# Patient Record
Sex: Female | Born: 1975 | Race: White | Hispanic: No | Marital: Single | Smoking: Current every day smoker
Health system: Southern US, Community
[De-identification: ages and names within clinical notes are randomized; demographics above are authoritative.]

## PROBLEM LIST (undated history)

## (undated) DIAGNOSIS — T7840XA Allergy, unspecified, initial encounter: Secondary | ICD-10-CM

## (undated) DIAGNOSIS — F32A Depression, unspecified: Secondary | ICD-10-CM

## (undated) DIAGNOSIS — F419 Anxiety disorder, unspecified: Secondary | ICD-10-CM

## (undated) DIAGNOSIS — F329 Major depressive disorder, single episode, unspecified: Secondary | ICD-10-CM

## (undated) DIAGNOSIS — G43909 Migraine, unspecified, not intractable, without status migrainosus: Secondary | ICD-10-CM

## (undated) DIAGNOSIS — I10 Essential (primary) hypertension: Secondary | ICD-10-CM

## (undated) DIAGNOSIS — M4800 Spinal stenosis, site unspecified: Secondary | ICD-10-CM

## (undated) DIAGNOSIS — G894 Chronic pain syndrome: Secondary | ICD-10-CM

## (undated) DIAGNOSIS — M47812 Spondylosis without myelopathy or radiculopathy, cervical region: Secondary | ICD-10-CM

## (undated) DIAGNOSIS — M47816 Spondylosis without myelopathy or radiculopathy, lumbar region: Secondary | ICD-10-CM

## (undated) HISTORY — DX: Spondylosis without myelopathy or radiculopathy, lumbar region: M47.816

## (undated) HISTORY — DX: Chronic pain syndrome: G89.4

## (undated) HISTORY — PX: WISDOM TOOTH EXTRACTION: SHX21

## (undated) HISTORY — DX: Allergy, unspecified, initial encounter: T78.40XA

## (undated) HISTORY — DX: Depression, unspecified: F32.A

## (undated) HISTORY — DX: Major depressive disorder, single episode, unspecified: F32.9

## (undated) HISTORY — DX: Essential (primary) hypertension: I10

## (undated) HISTORY — DX: Spondylosis without myelopathy or radiculopathy, cervical region: M47.812

## (undated) HISTORY — DX: Spinal stenosis, site unspecified: M48.00

## (undated) HISTORY — DX: Migraine, unspecified, not intractable, without status migrainosus: G43.909

## (undated) HISTORY — DX: Anxiety disorder, unspecified: F41.9

---

## 2004-07-14 ENCOUNTER — Ambulatory Visit: Payer: Self-pay | Admitting: Unknown Physician Specialty

## 2005-05-21 ENCOUNTER — Ambulatory Visit: Payer: Self-pay

## 2006-08-17 IMAGING — CT CT NECK WITH CONTRAST
1 of 2 series · 11 of 14 positions shown, 14 images · non-contrast
Comparison: none

REASON FOR EXAM: Thyroid nodule
COMMENTS:

[Series 3: neck 3.0 b40f · axial · 0.52mm/px · z∈[+108,+348]mm · 11 of 96 slices shown, 14 images]
[im 8/96  soft-tissue]
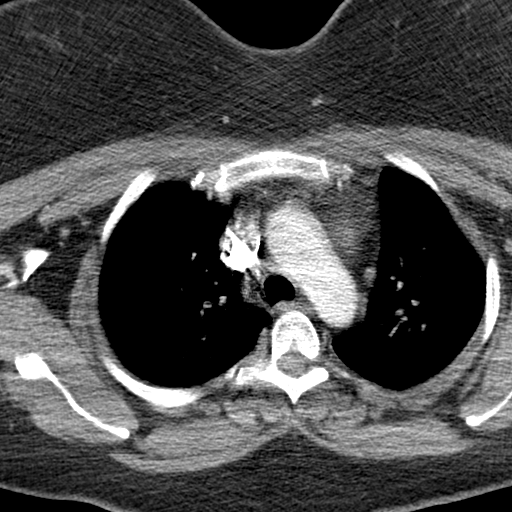
[im 8/96  bone]
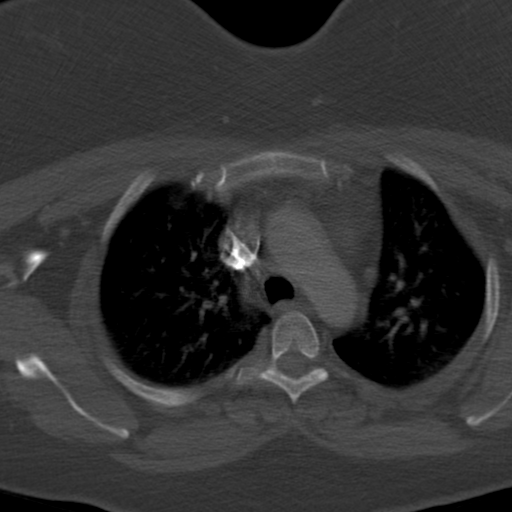
[im 16/96  bone]
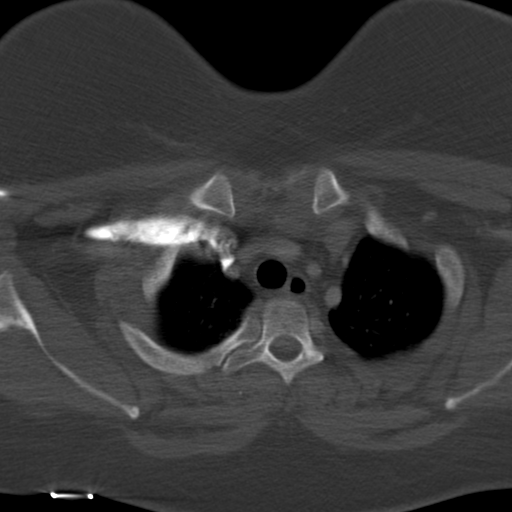
[im 24/96  bone]
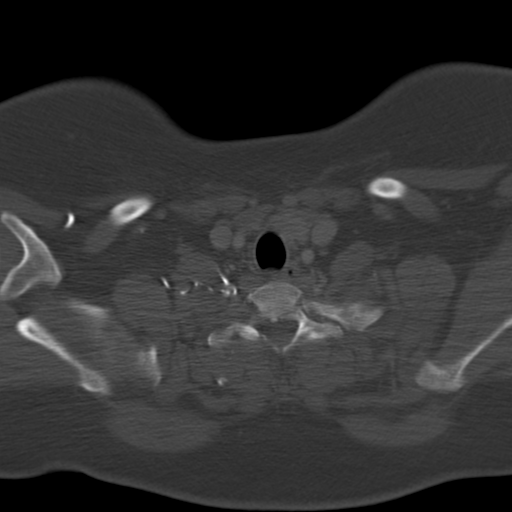
[im 32/96  bone]
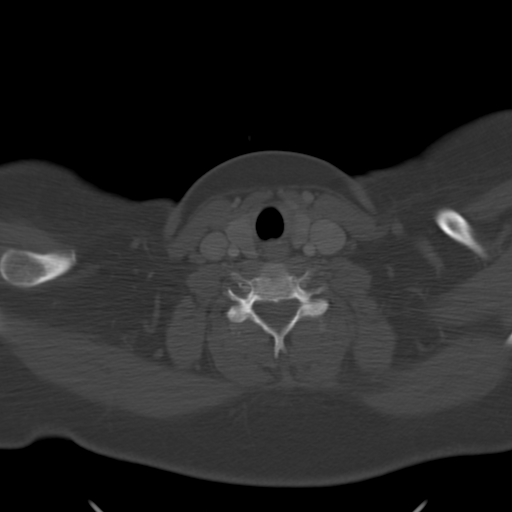
[im 40/96  soft-tissue]
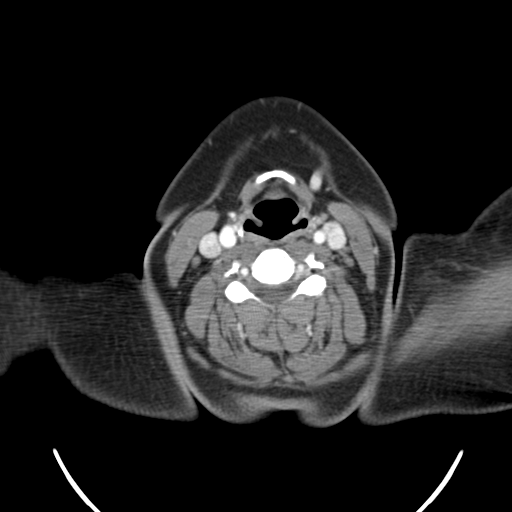
[im 40/96  bone]
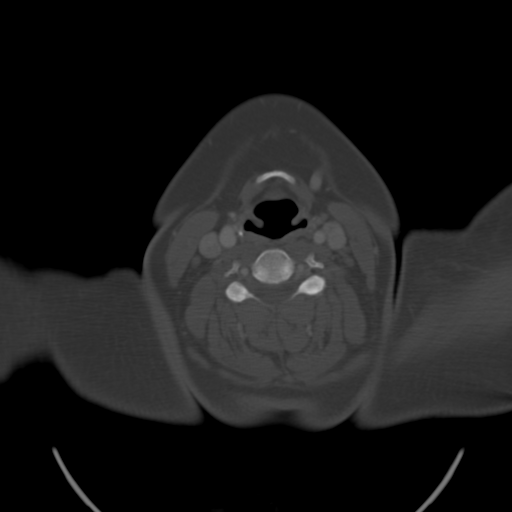
[im 48/96  bone]
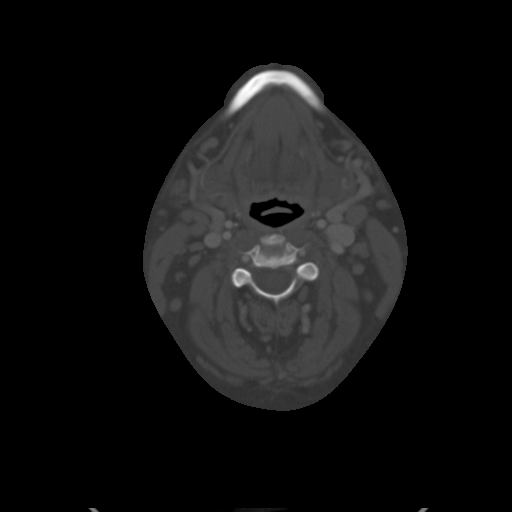
[im 56/96  bone]
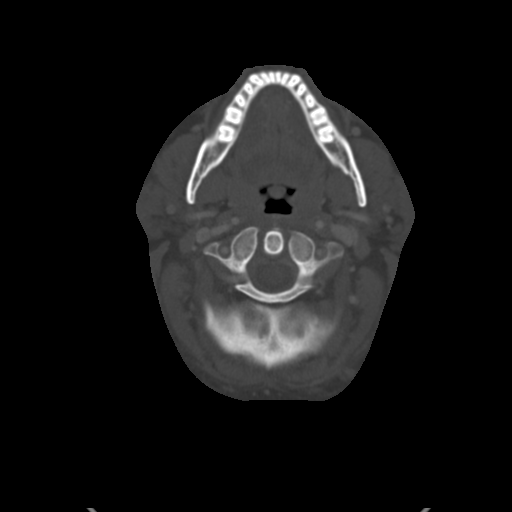
[im 64/96  bone]
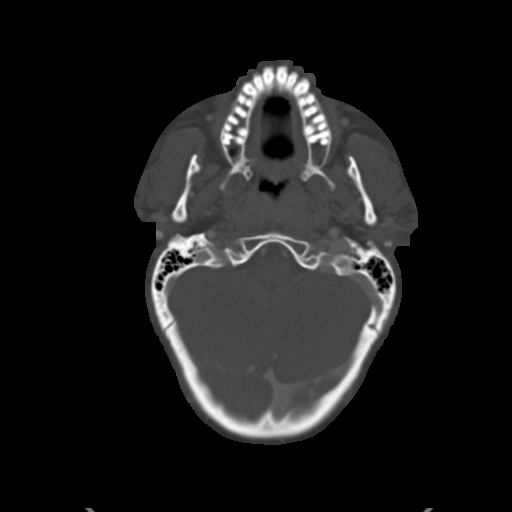
[im 72/96  soft-tissue]
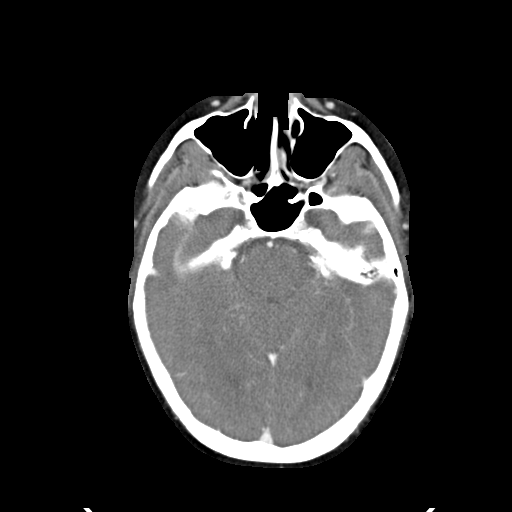
[im 72/96  bone]
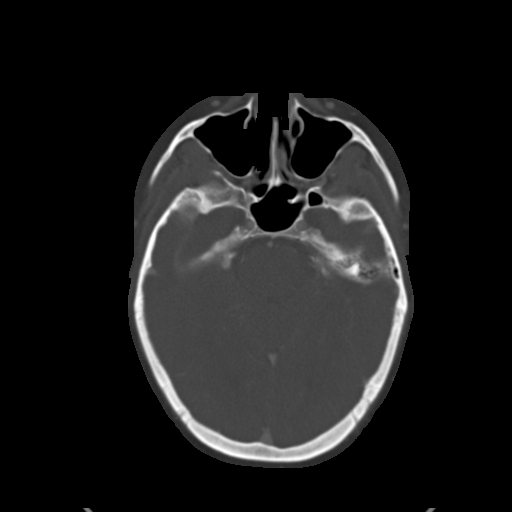
[im 80/96  bone]
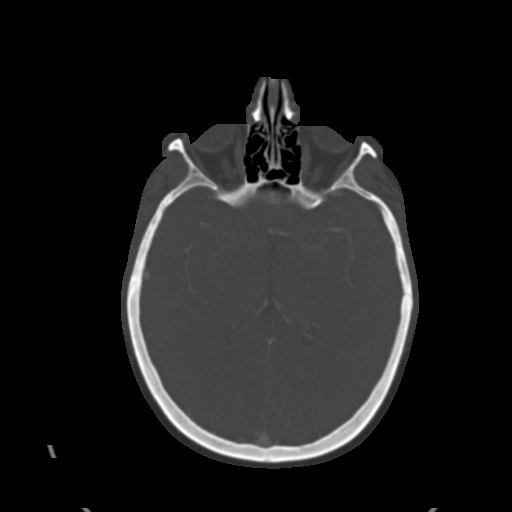
[im 88/96  bone]
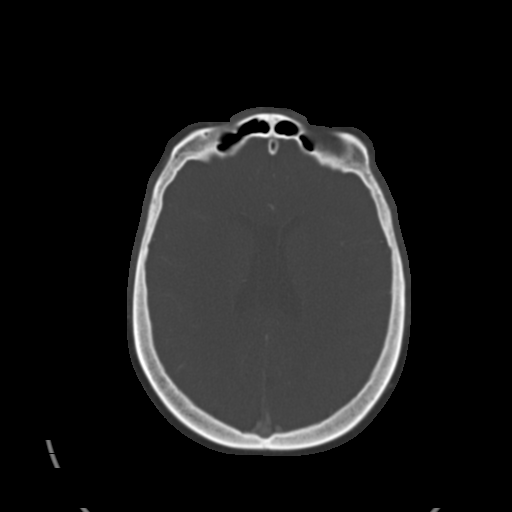

[11 of 14 positions shown; findings below may reference images not displayed]

PROCEDURE:     CT  - CT NECK WITH CONTRAST  - July 14, 2004  [DATE]

RESULT:     Spiral 3.0 mm sections were obtained from the skull base through
the thoracic inlet status post intravenous administration of 100 cc's of
[id number].

Bilateral adenopathy is demonstrated within the carotid space primarily
anteriorly with the largest lymph node on the LEFT measuring 1.0 cm in
diameter and on the RIGHT measuring 1.4 cm in diameter. No evidence of
further neck masses, free fluid or drainable loculated fluid collections are
identified. The opacified vascular structures appear unremarkable.
Evaluation of the thyroid demonstrates no evidence of thyroid nodules or
masses. The visualized lung apices appear unremarkable.
IMPRESSION: 1.     Bilateral lymph nodes within the RIGHT and LEFT carotid space as
described above with the largest nodes as described above, on the RIGHT
and on the LEFT 1.0 cm. No further neck masses are identified.
2.     There does not appear to be CT evidence of thyroid masses or nodules.
There does not appear to be CT evidence of thyroid enlargement.

## 2011-02-16 ENCOUNTER — Ambulatory Visit: Payer: Self-pay | Admitting: Family Medicine

## 2012-01-19 ENCOUNTER — Ambulatory Visit (INDEPENDENT_AMBULATORY_CARE_PROVIDER_SITE_OTHER): Payer: BC Managed Care – PPO | Admitting: Family Medicine

## 2012-01-19 VITALS — BP 147/99 | HR 100 | Temp 98.0°F | Resp 18 | Ht 64.5 in | Wt 248.4 lb

## 2012-01-19 DIAGNOSIS — Q762 Congenital spondylolisthesis: Secondary | ICD-10-CM

## 2012-01-19 DIAGNOSIS — M4310 Spondylolisthesis, site unspecified: Secondary | ICD-10-CM

## 2012-01-19 DIAGNOSIS — I10 Essential (primary) hypertension: Secondary | ICD-10-CM

## 2012-01-19 DIAGNOSIS — Z23 Encounter for immunization: Secondary | ICD-10-CM

## 2012-01-19 DIAGNOSIS — M4802 Spinal stenosis, cervical region: Secondary | ICD-10-CM

## 2012-01-19 DIAGNOSIS — F341 Dysthymic disorder: Secondary | ICD-10-CM

## 2012-01-19 DIAGNOSIS — G43909 Migraine, unspecified, not intractable, without status migrainosus: Secondary | ICD-10-CM

## 2012-01-19 DIAGNOSIS — F419 Anxiety disorder, unspecified: Secondary | ICD-10-CM

## 2012-01-19 MED ORDER — CYCLOBENZAPRINE HCL 10 MG PO TABS: 10.0000 mg | ORAL_TABLET | Freq: Three times a day (TID) | ORAL | Status: DC | PRN

## 2012-01-19 MED ORDER — OMEPRAZOLE 20 MG PO CPDR: 20.0000 mg | DELAYED_RELEASE_CAPSULE | Freq: Every day | ORAL | Status: DC

## 2012-01-19 MED ORDER — METOPROLOL TARTRATE 50 MG PO TABS: ORAL_TABLET | ORAL | Status: DC

## 2012-01-19 MED ORDER — MELOXICAM 15 MG PO TABS: 15.0000 mg | ORAL_TABLET | Freq: Every day | ORAL | Status: DC

## 2012-01-19 MED ORDER — PROMETHAZINE HCL 25 MG PO TABS: 25.0000 mg | ORAL_TABLET | Freq: Four times a day (QID) | ORAL | Status: DC | PRN

## 2012-01-19 MED ORDER — FLUTICASONE PROPIONATE 50 MCG/ACT NA SUSP: 2.0000 | Freq: Every day | NASAL | Status: DC

## 2012-01-19 MED ORDER — HYDROCODONE-ACETAMINOPHEN 7.5-500 MG PO TABS: 1.0000 | ORAL_TABLET | Freq: Three times a day (TID) | ORAL | Status: DC | PRN

## 2012-01-19 NOTE — Patient Instructions (Addendum)
1. Anxiety and depression   2. Cervical spinal stenosis   3. Essential hypertension, benign   4. Spondylolisthesis   5. Migraine

## 2012-01-19 NOTE — Progress Notes (Signed)
[address]   [place], [place]  [address]   [phone]  Subjective:    Patient ID: Natalie Dawson, female    DOB: 03-15-1976, 36 y.o.   MRN: [id number]  HPIThis 36 y.o. female presents for evaluation of back pain.   Around 12/06/11, onset of severe back pain and neck pain.  Acute worsening of pain.  At that time, had xray, MRI completed by [facility].  Had referred to [facility]. [facility]nie [facility].  Originally evaluated by PA.  Upon return of MRI, referred to Dr. Jaynie Collins.  MRI cervical spine  Completed; MRI lumbar spine abnormal.  Appointment 12/06/11 with Jaynie Collins; called to see if could get in sooner; called [facility] because of worsening pain.  Evaluated by Huston Foley September 12; unable to walk; unable to get into and out of car; of course, out of work.  Placed on Prednisone; took for two days; suffered with anxiety, insomnia, increased appetite.  Quit taking it after two days; called Huston Foley and advised unable to take Prednisone; Sharen Hint also filled Vicodin chronic rx.  Then called in Mobic but has not helped; has taken for past month.  Feels no different.  Called the next week while at work because out of Vicodin due to increased frequency.  Asked for Ultram rx because worked in past for back pain.  Sharen Hint refilled Vicodin early despite pt asking for Ultram rx on 12/27/11 for 30 tablets one every six hours PRN.  Two weeks later, needs more medication.  S/p evaluation by Dr. Jaynie Collins middle September; Dr. Jaynie Collins said would eventually need surgery; spinal stenosis of cervical spine and spondylolisthesis.  Low back pain does not bother her until she stands prolonged.  Upper back pain the worst; when worsens, unable to sleep.  Dr.Lim said OK to pop back.  Recommended PT.  When patient asked Dr. Jaynie Collins about pain medication, recommended going back to PCP.    Sharen Hint refused to refill due to appointment with [facility].  Lenoria Farrier office manager contacted by pt; Steward Drone Apple called [facility] regarding appointment.   Quit job during all this.  Job needed FMLA filled out again.  Steward Drone has been wonderful; she has called almost daily; Steward Drone said that Debbie recommended calling Nilda Simmer, M.D.  Needs to be able to function.  Mother works with patient.  [employer] was distraught due to patient quitting.  Mother was driving her crazy.     Review of Systems  Constitutional: Negative for fever, chills, diaphoresis and fatigue.  Respiratory: Negative for cough, shortness of breath and wheezing.   Cardiovascular: Negative for chest pain, palpitations and leg swelling.  Musculoskeletal: Positive for myalgias and back pain. Negative for joint swelling and arthralgias.  Neurological: Positive for headaches. Negative for dizziness, tremors, syncope, facial asymmetry, speech difficulty, weakness, light-headedness and numbness.  Psychiatric/Behavioral: Positive for dysphoric mood. Negative for suicidal ideas, behavioral problems, sleep disturbance, self-injury and agitation. The patient is nervous/anxious.         Past Medical History  Diagnosis Date  . Allergy   . Anxiety   . Depression   . Hypertension   . Spinal stenosis   . Migraine     Past Surgical History  Procedure Date  . Wisdom tooth extraction     Prior to Admission medications   Medication Sig Start Date End Date Taking? Authorizing Provider  ALPRAZolam Prudy Feeler) 0.5 MG tablet Take 1 tablet (0.5 mg total) by mouth at bedtime as needed. 02/25/12  Ethelda Chick, MD  amLODipine (NORVASC) 5 MG tablet Take 5 mg by mouth daily.   Yes Historical Provider, MD  cyclobenzaprine (FLEXERIL) 10 MG tablet Take 1 tablet (10 mg total) by mouth 3 (three) times daily as needed. 01/19/12  Yes Ethelda Chick, MD  fexofenadine-pseudoephedrine (ALLEGRA-D 24) 180-240 MG per 24 hr tablet Take 1 tablet by mouth daily.   Yes Historical Provider, MD  fluticasone (FLONASE) 50 MCG/ACT nasal spray Place 2 sprays into the nose daily. 01/19/12   Ethelda Chick, MD  fluticasone  (VERAMYST) 27.5 MCG/SPRAY nasal spray Place 2 sprays into the nose daily.   Yes Historical Provider, MD  HYDROcodone-acetaminophen (LORTAB 7.5) 7.5-500 MG per tablet Take 1 tablet by mouth every 8 (eight) hours as needed for pain. 01/19/12   Ethelda Chick, MD  HYDROcodone-acetaminophen (VICODIN) 5-500 MG per tablet Take 1 tablet by mouth every 6 (six) hours as needed.   Yes Historical Provider, MD  meloxicam (MOBIC) 15 MG tablet Take 1 tablet (15 mg total) by mouth daily. 01/19/12  Yes Ethelda Chick, MD  metoprolol (LOPRESSOR) 50 MG tablet 1/2 to 1 twice daily 01/19/12   Ethelda Chick, MD  omeprazole (PRILOSEC) 20 MG capsule Take 1 capsule (20 mg total) by mouth daily. 01/19/12  Yes Ethelda Chick, MD  promethazine (PHENERGAN) 25 MG tablet Take 1 tablet (25 mg total) by mouth every 6 (six) hours as needed. 01/19/12  Yes Ethelda Chick, MD  sertraline (ZOLOFT) 100 MG tablet Take 1 tablet (100 mg total) by mouth daily. 03/05/12   Nelva Nay, PA-C    Not on File  History   Social History  . Marital Status: Single    Spouse Name: N/A    Number of Children: N/A  . Years of Education: N/A   Occupational History  . Not on file.   Social History Main Topics  . Smoking status: Current Every Day Smoker -- 0.5 packs/day    Types: Cigarettes  . Smokeless tobacco: Not on file  . Alcohol Use: Not on file  . Drug Use: Not on file  . Sexually Active: Not on file   Other Topics Concern  . Not on file   Social History Narrative  . No narrative on file    Family History  Problem Relation Age of Onset  . Depression Mother   . Hyperlipidemia Mother   . Anxiety disorder Mother   . Diabetes Father   . Hypertension Father   . Cancer Maternal Grandmother   . Cancer Maternal Grandfather   . Heart attack Maternal Grandfather   . Cancer Paternal Grandmother   . Alzheimer's disease Paternal Grandmother   . Cancer Paternal Grandfather     Objective:   Physical Exam  Nursing note and  vitals reviewed. Constitutional: She is oriented to person, place, and time. She appears well-developed and well-nourished. No distress.  HENT:  Mouth/Throat: Oropharynx is clear and moist.  Eyes: Conjunctivae normal are normal. Pupils are equal, round, and reactive to light.  Neck: Neck supple. No JVD present. No thyromegaly present.  Cardiovascular: Normal rate, regular rhythm and normal heart sounds.  Exam reveals no gallop and no friction rub.   No murmur heard. Pulmonary/Chest: Effort normal and breath sounds normal. She has no wheezes. She has no rales.  Musculoskeletal:       Right shoulder: She exhibits normal range of motion, no tenderness and no bony tenderness.       Left shoulder:  She exhibits normal range of motion, no tenderness and no bony tenderness.       Cervical back: She exhibits decreased range of motion, tenderness, pain and spasm. She exhibits no bony tenderness.       Lumbar back: She exhibits tenderness and pain. She exhibits normal range of motion, no bony tenderness and no spasm.  Lymphadenopathy:    She has no cervical adenopathy.  Neurological: She is alert and oriented to person, place, and time. No cranial nerve deficit. She exhibits normal muscle tone. Coordination normal.  Skin: No rash noted. She is not diaphoretic.  Psychiatric: She has a normal mood and affect. Her behavior is normal. Judgment and thought content normal.   INFLUENZA VACCINE ADMINISTERED.    Assessment & Plan:   1. Anxiety and depression    2. Cervical spinal stenosis    3. Essential hypertension, benign    4. Spondylolisthesis    5. Migraine    6. Need for prophylactic vaccination and inoculation against influenza  Flu vaccine greater than or equal to 3yo preservative free IM    1.  Anxiety and depression:  Persistent.  Unable to see psychiatry due to lack of insurance.  Refilled medications today. 2.  Cervical spinal stenosis:  New.  S/p ortho consultation with MRI.  Physical  therapy and pain management recommended but patient now unemployed. Agreeable to managing pain until finds employment; once employed will warrant referral to pain management. 3. HTN:  Rx for Metoprolol provided.   4.  Spondylolisthesis:  New.  S/p ortho consult.  PT and pain management recommended but now uninsured.  Willing to provide with pain management until employed.   5. Migraine HA:  Stable.  Refilled medications. 6. S/p flu vaccine.   Meds ordered this encounter  Medications  . DISCONTD: omeprazole (PRILOSEC) 20 MG capsule    Sig: Take 20 mg by mouth daily.  . fexofenadine-pseudoephedrine (ALLEGRA-D 24) 180-240 MG per 24 hr tablet    Sig: Take 1 tablet by mouth daily.  Marland Kitchen DISCONTD: sertraline (ZOLOFT) 100 MG tablet    Sig: Take 100 mg by mouth daily.  Marland Kitchen amLODipine (NORVASC) 5 MG tablet    Sig: Take 5 mg by mouth daily.  Marland Kitchen DISCONTD: meloxicam (MOBIC) 15 MG tablet    Sig: Take 15 mg by mouth daily.  Marland Kitchen DISCONTD: cyclobenzaprine (FLEXERIL) 10 MG tablet    Sig: Take 10 mg by mouth 3 (three) times daily as needed.  Marland Kitchen HYDROcodone-acetaminophen (VICODIN) 5-500 MG per tablet    Sig: Take 1 tablet by mouth every 6 (six) hours as needed.  Marland Kitchen DISCONTD: promethazine (PHENERGAN) 25 MG tablet    Sig: Take 25 mg by mouth every 6 (six) hours as needed.  Marland Kitchen DISCONTD: ALPRAZolam (XANAX) 0.5 MG tablet    Sig: Take 0.5 mg by mouth at bedtime as needed.  . fluticasone (VERAMYST) 27.5 MCG/SPRAY nasal spray    Sig: Place 2 sprays into the nose daily.  . cyclobenzaprine (FLEXERIL) 10 MG tablet    Sig: Take 1 tablet (10 mg total) by mouth 3 (three) times daily as needed.    Dispense:  270 tablet    Refill:  1  . fluticasone (FLONASE) 50 MCG/ACT nasal spray    Sig: Place 2 sprays into the nose daily.    Dispense:  16 g    Refill:  6  . meloxicam (MOBIC) 15 MG tablet    Sig: Take 1 tablet (15 mg total) by mouth daily.  Dispense:  90 tablet    Refill:  1  . omeprazole (PRILOSEC) 20 MG capsule     Sig: Take 1 capsule (20 mg total) by mouth daily.    Dispense:  90 capsule    Refill:  1  . promethazine (PHENERGAN) 25 MG tablet    Sig: Take 1 tablet (25 mg total) by mouth every 6 (six) hours as needed.    Dispense:  60 tablet    Refill:  5  . metoprolol (LOPRESSOR) 50 MG tablet    Sig: 1/2 to 1 twice daily    Dispense:  180 tablet    Refill:  3  . HYDROcodone-acetaminophen (LORTAB 7.5) 7.5-500 MG per tablet    Sig: Take 1 tablet by mouth every 8 (eight) hours as needed for pain.    Dispense:  90 tablet    Refill:  2

## 2012-02-25 ENCOUNTER — Telehealth: Payer: Self-pay

## 2012-02-25 MED ORDER — ALPRAZOLAM 0.5 MG PO TABS: 0.5000 mg | ORAL_TABLET | Freq: Every evening | ORAL | Status: DC | PRN

## 2012-02-25 NOTE — Telephone Encounter (Signed)
Called in for her, called her to advise.  

## 2012-02-25 NOTE — Telephone Encounter (Signed)
OK to call in:  Alprazolam 0.5mg  one po q d PRN #30 5 refills.  KMS

## 2012-02-25 NOTE — Telephone Encounter (Signed)
PATIENT IS REQUESTING A REFILL ON ALPRAZOLAM 0.5MG . SHE STATES SHE IS DR. SMITH'S PATIENT. PLEASE CALL HER WHEN IT HAS BEEN DONE. BEST PHONE [phone] (CELL)  [facility]ICE [facility] [place] [phone]   MBC

## 2012-03-05 ENCOUNTER — Encounter: Payer: Self-pay | Admitting: Family Medicine

## 2012-03-05 ENCOUNTER — Telehealth: Payer: Self-pay

## 2012-03-05 MED ORDER — SERTRALINE HCL 100 MG PO TABS: 100.0000 mg | ORAL_TABLET | Freq: Every day | ORAL | Status: DC

## 2012-03-05 NOTE — Telephone Encounter (Signed)
Rx done and sent to pharmacy 

## 2012-03-05 NOTE — Telephone Encounter (Signed)
Dr. Katrinka Blazing   Pt is requesting refill on Zoloft 100 mg - [facility] [place]   CBN:  [phone]

## 2012-03-06 NOTE — Telephone Encounter (Signed)
Thanks, I called patient to advise.  

## 2012-03-10 NOTE — Progress Notes (Signed)
Reviewed and agree.

## 2012-04-02 ENCOUNTER — Other Ambulatory Visit: Payer: Self-pay | Admitting: Family Medicine

## 2012-04-03 ENCOUNTER — Telehealth: Payer: Self-pay

## 2012-04-03 NOTE — Telephone Encounter (Signed)
Please advise on the Hydrocodone. Pended same as previous Rx

## 2012-04-03 NOTE — Telephone Encounter (Signed)
Pt calling to make sure we have received her hydrocodone rx from the pharmacy. Please call her at [phone]

## 2012-04-04 ENCOUNTER — Other Ambulatory Visit: Payer: Self-pay | Admitting: Family Medicine

## 2012-04-04 MED ORDER — HYDROCODONE-ACETAMINOPHEN 7.5-500 MG PO TABS: 1.0000 | ORAL_TABLET | Freq: Three times a day (TID) | ORAL | Status: DC | PRN

## 2012-04-04 NOTE — Telephone Encounter (Signed)
Dr Katrinka Blazing, please advise. Also, we did not see your printed/signed Rx, but I can still call Rx in from the info in EPIC. What do you want to do about the fill date?

## 2012-04-04 NOTE — Telephone Encounter (Signed)
1.  Please call pt back. I do not remember advising her that she could take an extra pill if pain worsened or with migraine.  Usually, if I advise such, I will give a few additional pills with rx.  However, she is 16 days early for refill; thus that means she has taken 48 additional pills over the past three months (i.e. An extra 16 pills per month which is usually one extra pill every other day) which I know I did not approve.  I also discussed my strict narcotic policy with her at the visit; she is not to lose any rx, she is not to need early refills, she is not to receive medication from any other providers.  How many pills does she have left of current rx?

## 2012-04-04 NOTE — Telephone Encounter (Signed)
Call --- I am OK if she needs a fourth tablet once per week on average but no more than that.  OK to call in RF.  I already approved rx but did not print off rx; I will do this in the future.  Please call in RF to pharmacy.

## 2012-04-04 NOTE — Telephone Encounter (Signed)
Pt called about hydrocodone refill. I relayed the communication with the dr to her and she states that the pharmacy has not heard from [place] and that she will be out before the fill date of 1/26 b/c dr Katrinka Blazing told her if she was having a bad day or migraine she could take an extra one, which throws the count off.   Please call pt with questions. She is frustrated that she was told differently about her dose than she is receiving.  bf

## 2012-04-04 NOTE — Telephone Encounter (Signed)
Called pt and gave her Dr Michaelle Copas message. She stated that she really did think that Dr Katrinka Blazing had told her that if she is having an esp bad day, or has a migraine, she could take a fourth tab that day. She stated she has 4 tabs left. She stated that she got the first Rx filled 10/26 for #90, then next fill 11/21 for #90, then last filled on 12/16 for #90. I called pharmacy and verified this is correct. She states that if you don't ever want her taking more than 3 tabs a day, she will abide by that from now on, but she is worried about what she should do for migraines when she gets them since she can not use Imitrex NS anymore d/t BP. Pt states that she thinks she is supposed to come back for a re-check on her BP/new medication soon anyway and that if Dr Katrinka Blazing will RF the hydrocodone x 1, she will plan to come in for re-check for both w/in the month. Dr Katrinka Blazing, please advise.

## 2012-04-04 NOTE — Telephone Encounter (Signed)
Please call in refill for hydrocodone that was signed/approved.  After reviewing chart, I provided original rx on 10/26 with two refills.  She will not be due for refill until 04/20/12.  I have noted this on rx.  KMS

## 2012-04-06 NOTE — Telephone Encounter (Signed)
lmom to cb. 

## 2012-04-06 NOTE — Telephone Encounter (Signed)
Pt states that she cannot wait until 1/26 for the hydrocodone.  She took her last tablet today.  Would you like for her to just come in to discuss this issue?

## 2012-04-08 NOTE — Telephone Encounter (Signed)
Called in Rx to pharmacy and gave OK to fill today. Notified pt on her VM that we have called it in and that Dr Katrinka Blazing does not want pt to take any more than 1 extra tab per week. Advised pt to CB if she needs extra more often than that for migraines so that we can discuss w/Dr Katrinka Blazing if another medication would be appropriate for that.

## 2012-04-08 NOTE — Telephone Encounter (Signed)
OK to approve early refill for fill today.  No need to come into office to discuss.

## 2012-05-28 ENCOUNTER — Telehealth: Payer: Self-pay

## 2012-05-28 NOTE — Telephone Encounter (Signed)
PT WOULD LIKE TO SPEAK WITH SOMEONE REGARDING HER VICODIN. STATES THE PHARMACY STATED THE STRENGTH ON THE VICODIN IS NO LONGER DONE WOULD LIKE A CALL BACK AT [phone]

## 2012-05-29 NOTE — Telephone Encounter (Signed)
Left message for her to call me back. 

## 2012-05-29 NOTE — Telephone Encounter (Signed)
Called [facility]  [place] to change remaining Rx's for Hydrocodone from 7.5/500 to 7.5 325, to you FYI Amy

## 2012-06-14 ENCOUNTER — Encounter: Payer: Self-pay | Admitting: *Deleted

## 2012-06-14 DIAGNOSIS — M4800 Spinal stenosis, site unspecified: Secondary | ICD-10-CM | POA: Insufficient documentation

## 2012-06-30 ENCOUNTER — Telehealth: Payer: Self-pay

## 2012-06-30 ENCOUNTER — Other Ambulatory Visit: Payer: Self-pay | Admitting: Family Medicine

## 2012-06-30 NOTE — Telephone Encounter (Signed)
Call -- rx approved; should be called into pharmacy today; please advise pt that she will need follow-up visit in upcoming month before she can get further refills.

## 2012-06-30 NOTE — Telephone Encounter (Signed)
Pt states we should be receiving a refill request from [facility] for her hydrocodone. States due to dosage changes and only filling certain amounts in the last couple of months, she will be out before her fill date. States she has enough for today and tomorrow but none for Wednesday. States not supposed to refill until 07/07/12. Pt sees Dr Katrinka Blazing.  Best: [phone] bf

## 2012-06-30 NOTE — Telephone Encounter (Signed)
Called in for her. Called her to advise meds sent in for her, told her she needs office visit. She will call back for office visit.

## 2012-07-24 ENCOUNTER — Ambulatory Visit (INDEPENDENT_AMBULATORY_CARE_PROVIDER_SITE_OTHER): Payer: Self-pay | Admitting: Family Medicine

## 2012-07-24 VITALS — BP 150/103 | HR 93 | Temp 99.3°F | Resp 18 | Ht 65.0 in | Wt 265.0 lb

## 2012-07-24 DIAGNOSIS — F419 Anxiety disorder, unspecified: Secondary | ICD-10-CM

## 2012-07-24 DIAGNOSIS — I10 Essential (primary) hypertension: Secondary | ICD-10-CM

## 2012-07-24 DIAGNOSIS — R5383 Other fatigue: Secondary | ICD-10-CM

## 2012-07-24 DIAGNOSIS — F411 Generalized anxiety disorder: Secondary | ICD-10-CM

## 2012-07-24 DIAGNOSIS — M4800 Spinal stenosis, site unspecified: Secondary | ICD-10-CM

## 2012-07-24 LAB — POCT CBC
Granulocyte percent: 54.2 %G (ref 37–80)
HCT, POC: 44.9 % (ref 37.7–47.9)
Hemoglobin: 14.1 g/dL (ref 12.2–16.2)
POC Granulocyte: 5.4 (ref 2–6.9)
POC LYMPH PERCENT: 35.6 %L (ref 10–50)
RDW, POC: 12.7 %

## 2012-07-24 LAB — POCT URINALYSIS DIPSTICK
Bilirubin, UA: NEGATIVE
Ketones, UA: NEGATIVE
Leukocytes, UA: NEGATIVE
Protein, UA: NEGATIVE

## 2012-07-24 MED ORDER — SERTRALINE HCL 100 MG PO TABS: 150.0000 mg | ORAL_TABLET | Freq: Every day | ORAL | Status: DC

## 2012-07-24 MED ORDER — FLUTICASONE PROPIONATE 50 MCG/ACT NA SUSP: 2.0000 | Freq: Every day | NASAL | Status: DC

## 2012-07-24 MED ORDER — HYDROCODONE-ACETAMINOPHEN 7.5-325 MG PO TABS: 1.0000 | ORAL_TABLET | Freq: Three times a day (TID) | ORAL | Status: DC | PRN

## 2012-07-24 MED ORDER — ALPRAZOLAM 0.5 MG PO TABS: 0.5000 mg | ORAL_TABLET | Freq: Every evening | ORAL | Status: DC | PRN

## 2012-07-24 NOTE — Patient Instructions (Signed)
1. INCREASE METOPROLOL TO TWICE DAILY. 2. INCREASE ZOLOFT TO 1.5 TABLETS DAILY.

## 2012-07-24 NOTE — Progress Notes (Signed)
[address]   [place], [place]  [address]   [id number]  Subjective:    Patient ID: Natalie Dawson, female    DOB: April 10, 1975, 36 y.o.   MRN: [id number]  HPI This 37 y.o. female presents for six month follow-up:  1.  Anxiety and depression: mentally doing much better; left [facility] in 04/2012.  Thereasa Distance still not working; pt thinks Thereasa Distance is depressed.  Has new boyfriend; dating since March 2014.  Living with mother and Bethann Berkshire.  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  Work is exhausting pt.  Depression has improved but now has noticed that anxiety has worsened; realizes that should not need Xanax daily but continues to use daily.  Uneasy that staying with mother who does not really want her living with her.    2.  Fatigue:  Absolutely exhausted. No energy.  New boyfriend very active.  Wants to go do things but physically does not want to.  Worried about secondary cause.  Just doesn't feel good.  Feels like sugar drops; gets nauseated and hungry.  Went to [employer] and ate some fries; felt better but nausea returns.  Googled symptoms; has every symptom of fibromyalgia.  Father with multiple health problems; pt is a clone.  Father is DMII, ESRD secondary to HTN, melanoma, DDD, macular degeneration.  Last lab work several months ago.  No previous sleep study; has gained weight since last visit.  3. Bump in genital region: onset one week ago; no improvement; tender.  No burning; does not feel like wart.  New partner; worried about STD.  4. HTN: not checking blood pressure at home; only taking Metoprolol one at qhs due to daytime nausea.    5.  Nausea: occurs frequently; has been going on several months; not sure if narcotic is cause of nausea.  No vomiting; worried about low blood sugar; nausea improves with eating; some heartburn and indigestion; no diarrhea; has gained weight since last visit.  6. Chronic neck and lower back pain: persistent; taking hydrocodone tid; could use  hydrocodone more often; taking Ibuprofen in between hydrocodone use.  Caring for twins who are 2 old is very exhausting.  Has contacted [facility] pain management several times; awaiting appointment; has long waiting list. No insurance currently.   No follow-up with [facility] spine specialist.     Review of Systems  Constitutional: Positive for activity change and fatigue. Negative for fever, chills and diaphoresis.  Respiratory: Positive for shortness of breath. Negative for cough, wheezing and stridor.   Cardiovascular: Negative for chest pain, palpitations and leg swelling.  Gastrointestinal: Positive for nausea. Negative for vomiting, abdominal pain, diarrhea, constipation and blood in stool.  Endocrine: Negative for cold intolerance, heat intolerance, polydipsia, polyphagia and polyuria.  Genitourinary: Positive for genital sores. Negative for vaginal bleeding, vaginal discharge, vaginal pain, menstrual problem and pelvic pain.  Musculoskeletal: Positive for myalgias, back pain, arthralgias and gait problem. Negative for joint swelling.  Skin: Negative for wound.  Neurological: Positive for headaches. Negative for dizziness, tremors, seizures, syncope, facial asymmetry, speech difficulty, weakness, light-headedness and numbness.  Psychiatric/Behavioral: Negative for suicidal ideas, sleep disturbance, self-injury and dysphoric mood. The patient is nervous/anxious.        Objective:   Physical Exam  Nursing note and vitals reviewed. Constitutional: She is oriented to person, place, and time. She appears well-developed and well-nourished. No distress.  Obese.  HENT:  Head: Normocephalic and atraumatic.  Mouth/Throat: Oropharynx is clear and moist.  Eyes: Conjunctivae and EOM are normal. Pupils are equal, round, and reactive to light.  Neck: Normal range of motion. Neck supple. No thyromegaly present.  Obese.  Cardiovascular: Normal rate, regular rhythm, normal heart sounds and intact  distal pulses.  Exam reveals no gallop and no friction rub.   No murmur heard. Pulmonary/Chest: Effort normal and breath sounds normal. She has no wheezes. She has no rales.  Abdominal: Soft. Bowel sounds are normal. She exhibits no distension. There is no tenderness. There is no rebound and no guarding.  Genitourinary: There is no rash, tenderness or lesion on the right labia. There is no rash, tenderness or lesion on the left labia.  R labia majora with palpable 3 mm diameter mobile glandular lesion.  Musculoskeletal:       Right shoulder: Normal.       Left shoulder: Normal.       Cervical back: She exhibits normal range of motion and no tenderness.       Lumbar back: She exhibits decreased range of motion, tenderness and pain. She exhibits no bony tenderness and no spasm.  Lymphadenopathy:    She has no cervical adenopathy.  Neurological: She is alert and oriented to person, place, and time. No cranial nerve deficit. She exhibits normal muscle tone. Coordination normal.  Skin: Skin is warm and dry. She is not diaphoretic.  Psychiatric: She has a normal mood and affect. Her behavior is normal. Judgment and thought content normal.          Results for orders placed in visit on 07/24/12  POCT CBC      Result Value Range   WBC 10.0  4.6 - 10.2 K/uL   Lymph, poc 3.6 (*) 0.6 - 3.4   POC LYMPH PERCENT 35.6  10 - 50 %L   MID (cbc) 1.0 (*) 0 - 0.9   POC MID % 10.2  0 - 12 %M   POC Granulocyte 5.4  2 - 6.9   Granulocyte percent 54.2  37 - 80 %G   RBC 4.85  4.04 - 5.48 M/uL   Hemoglobin 14.1  12.2 - 16.2 g/dL   HCT, POC 81.1  91.4 - 47.9 %   MCV 92.6  80 - 97 fL   MCH, POC 29.1  27 - 31.2 pg   MCHC 31.4 (*) 31.8 - 35.4 g/dL   RDW, POC 78.2     Platelet Count, POC 248  142 - 424 K/uL   MPV 10.1  0 - 99.8 fL  POCT URINALYSIS DIPSTICK      Result Value Range   Color, UA yellow     Clarity, UA clear     Glucose, UA neg     Bilirubin, UA neg     Ketones, UA neg     Spec Grav, UA  >=1.030     Blood, UA trace     pH, UA 5.5     Protein, UA neg     Urobilinogen, UA 0.2     Nitrite, UA neg     Leukocytes, UA Negative      Assessment & Plan:  Other malaise and fatigue - Plan: POCT CBC, Comprehensive metabolic panel, TSH, POCT urinalysis dipstick  Spinal stenosis  Essential hypertension, benign  Anxiety   1. Malaise and fatigue:  New.  Obtain labs.  Highly suggestive of OSA yet currently no insurance so cannot perform sleep study; recommend weight loss, exercise. 2.  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  Discussed addition of Neurontin yet sedating and pt suffering with fatigue.  Recommend Flexeril qhs for spasm. 3.  HTN: uncontrolled due to non-compliance with bid Metoprolol; to increase Metoprolol to bid; to start checking BP at home and call if readings remain >  140/90. 4.  Anxiety and depression: improving depression symptoms but anxiety worsening; increase Zoloft 100mg  to 1.5 tablets daily; continue once daily Xanax. 5.  Nausea; New.  Obtain labs; likely due to narcotic use; recommend HOLDING narcotic in am to see if nausea improves.   Meds ordered this encounter  Medications  . B Complex-C (B-COMPLEX WITH VITAMIN C) tablet    Sig: Take 1 tablet by mouth daily.  . Multiple Vitamin (MULTIVITAMIN) tablet    Sig: Take 1 tablet by mouth daily.  . sertraline (ZOLOFT) 100 MG tablet    Sig: Take 1.5 tablets (150 mg total) by mouth daily.    Dispense:  45 tablet    Refill:  5    Order Specific Question:  Supervising Provider    Answer:  DOOLITTLE, ROBERT P [[id number]]  . HYDROcodone-acetaminophen (NORCO) 7.5-325 MG per tablet    Sig: Take 1 tablet by mouth every 8 (eight) hours as needed for pain.    Dispense:  90 tablet    Refill:  0  . fluticasone (FLONASE) 50 MCG/ACT nasal spray     Sig: Place 2 sprays into the nose daily.    Dispense:  16 g    Refill:  6  . ALPRAZolam (XANAX) 0.5 MG tablet    Sig: Take 1 tablet (0.5 mg total) by mouth at bedtime as needed.    Dispense:  30 tablet    Refill:  5

## 2012-07-25 LAB — COMPREHENSIVE METABOLIC PANEL
ALT: 21 U/L (ref 0–35)
Albumin: 4.5 g/dL (ref 3.5–5.2)
Alkaline Phosphatase: 85 U/L (ref 39–117)
Glucose, Bld: 89 mg/dL (ref 70–99)
Potassium: 4.1 mEq/L (ref 3.5–5.3)
Sodium: 140 mEq/L (ref 135–145)
Total Protein: 7.4 g/dL (ref 6.0–8.3)

## 2012-07-31 NOTE — Progress Notes (Signed)
Left msg for patient to schedule 3 month appt with Dr. Katrinka Blazing.

## 2012-08-05 NOTE — Progress Notes (Signed)
Sent pt reminder letter to schedule 3 month follow up appt.

## 2012-08-25 ENCOUNTER — Other Ambulatory Visit: Payer: Self-pay | Admitting: Family Medicine

## 2012-08-25 ENCOUNTER — Telehealth: Payer: Self-pay

## 2012-08-25 NOTE — Telephone Encounter (Signed)
PT STATES SHE NEED A REFILL ON HER HYDROCODONE. PLEASE CALL [phone]

## 2012-08-26 ENCOUNTER — Telehealth: Payer: Self-pay

## 2012-08-26 NOTE — Telephone Encounter (Signed)
Patient is calling back about her hydrocodone medication. York Spaniel that is should be sent over to her pharmacy. [phone]

## 2012-08-27 NOTE — Telephone Encounter (Signed)
Hydrocodone refilled 08/27/12.

## 2012-08-27 NOTE — Telephone Encounter (Signed)
Called in for her. Called patient. She states she is feeling better.

## 2012-08-27 NOTE — Telephone Encounter (Signed)
Rx approved and to be called in.  Please check status and advise pt.

## 2012-10-11 ENCOUNTER — Other Ambulatory Visit: Payer: Self-pay | Admitting: Physician Assistant

## 2012-10-28 ENCOUNTER — Ambulatory Visit: Payer: Self-pay | Admitting: Family Medicine

## 2012-10-28 ENCOUNTER — Encounter: Payer: Self-pay | Admitting: Family Medicine

## 2012-10-28 VITALS — BP 118/80 | HR 68 | Temp 98.7°F | Resp 16 | Ht 64.0 in | Wt 269.8 lb

## 2012-10-28 DIAGNOSIS — M4800 Spinal stenosis, site unspecified: Secondary | ICD-10-CM

## 2012-10-28 DIAGNOSIS — F341 Dysthymic disorder: Secondary | ICD-10-CM

## 2012-10-28 DIAGNOSIS — F329 Major depressive disorder, single episode, unspecified: Secondary | ICD-10-CM

## 2012-10-28 DIAGNOSIS — I10 Essential (primary) hypertension: Secondary | ICD-10-CM

## 2012-10-28 DIAGNOSIS — R5381 Other malaise: Secondary | ICD-10-CM | POA: Insufficient documentation

## 2012-10-28 DIAGNOSIS — G894 Chronic pain syndrome: Secondary | ICD-10-CM

## 2012-10-28 DIAGNOSIS — R5383 Other fatigue: Secondary | ICD-10-CM

## 2012-10-28 DIAGNOSIS — R110 Nausea: Secondary | ICD-10-CM | POA: Insufficient documentation

## 2012-10-28 MED ORDER — HYDROCODONE-ACETAMINOPHEN 7.5-325 MG PO TABS: 1.0000 | ORAL_TABLET | Freq: Four times a day (QID) | ORAL | Status: DC | PRN

## 2012-10-28 NOTE — Progress Notes (Signed)
[address]   [place], [place]  [address]   [phone]  Subjective:    Patient ID: Natalie Dawson, female    DOB: 29-Sep-1975, 37 y.o.   MRN: [id number]  HPI This 37 y.o. female presents for three month follow=-up:  1. HTN; improved compliance with Metoprolol to bid dose; feeling better; fatigue better; nausea better.  Takes all other medication at bedtime; now that taking Neurontin tid can remember.  BP had been running high while working in 12/2011.    2.  Anxiety and depression: increased Zoloft to 150mg ; still has days where anxiety cranks up; much less.  Having 2 bad days per week; previously having daily anxiety issues.  Has Dad's worrier.  Mother also with severe anxiety; has been able to decrease Xanax to twice weekly.   3. Fatigue: much better since last visit; improved one month ago.  Still has fatigued days but much better.  4.  Nausea: resolved.    5.  Chronic neck pain and spinal stenosis: s/p pain specialist consultation; one month after last visit.  Called to check on appointment.  Placed on Neurontin 300mg  tid.  Placed on Ultram 50mg  four times daily.  Provided with letter to give to me; he wanted PCP to maintain hydrodocone. Also ordered injections for neck and lower back; not scheduled; waiting on [facility].  May have ordered xray on R ankle.  No scheduled follow-up; once [facility] approved, recommend follow-up.  Taking Hydrocodone tid.  Rather take hydrocodone 4 times per day.  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  MWF.  Specialist wants Ultram four times daily between the Vicodin.      Review of Systems  Constitutional: Negative for fever, chills, diaphoresis and fatigue.  Eyes: Negative for photophobia and visual disturbance.  Respiratory: Negative for shortness of breath, wheezing and stridor.   Cardiovascular: Negative for chest pain, palpitations and leg swelling.  Gastrointestinal: Negative for nausea, vomiting, abdominal pain, diarrhea and  constipation.  Musculoskeletal: Positive for myalgias and back pain. Negative for joint swelling, arthralgias and gait problem.  Neurological: Negative for weakness and numbness.  Psychiatric/Behavioral: Negative for sleep disturbance, self-injury and dysphoric mood. The patient is nervous/anxious.        Objective:   Physical Exam  Nursing note and vitals reviewed. Constitutional: She is oriented to person, place, and time. She appears well-developed and well-nourished. No distress.  HENT:  Head: Normocephalic and atraumatic.  Eyes: Conjunctivae and EOM are normal. Pupils are equal, round, and reactive to light.  Neck: Normal range of motion. Neck supple. No JVD present. No thyromegaly present.  Cardiovascular: Normal rate, regular rhythm, normal heart sounds and intact distal pulses.  Exam reveals no gallop and no friction rub.   No murmur heard. Pulmonary/Chest: Effort normal and breath sounds normal. She has no wheezes. She has no rales.  Musculoskeletal:       Cervical back: Normal.       Lumbar back: Normal.  Lymphadenopathy:    She has no cervical adenopathy.  Neurological: She is alert and oriented to person, place, and time. No cranial nerve deficit. She exhibits normal muscle tone. Coordination normal.  Skin: Skin is warm and dry. No rash noted. She is not diaphoretic.  Psychiatric: She has a normal mood and affect. Her behavior is normal. Judgment and thought content normal.       Assessment & Plan:  Spinal stenosis - Plan: HYDROcodone-acetaminophen (NORCO) 7.5-325 MG per tablet, DISCONTINUED: HYDROcodone-acetaminophen (NORCO) 7.5-325  MG per tablet  Essential hypertension, benign  Anxiety and depression  Other malaise and fatigue  Nausea alone  Chronic pain syndrome - Plan: HYDROcodone-acetaminophen (NORCO) 7.5-325 MG per tablet, DISCONTINUED: HYDROcodone-acetaminophen (NORCO) 7.5-325 MG per tablet   1. Spinal stenosis: stable; s/p pain clinic consultation; added  Neurontin and Ultram; recommended continued Hydrocodone usage and recommended that PCP manage for now.  Refill of Norco provided at qid dosing; will not increase frequency or dose of Norco. Pain clinic recommends injection when pt approved for [facility]. 2.  HTN: improved with improved compliance with Metoprolol. 3.  Anxiety and depression: improved with increased Zoloft dose; no change in management; now using Xanax twice weekly instead of daily. 4.  Nausea: resolved. 5. Chronic lower back pain: Stable; s/p pain management consultation; recommends injections in back when patient gets [facility].  Meds ordered this encounter  Medications  . gabapentin (NEURONTIN) 300 MG capsule    Sig: Take 300 mg by mouth.  . traMADol (ULTRAM) 50 MG tablet    Sig: Take 50 mg by mouth every 6 (six) hours as needed for pain.  . cyclobenzaprine (FLEXERIL) 10 MG tablet    Sig: Take 10 mg by mouth 3 (three) times daily as needed for muscle spasms.  Marland Kitchen DISCONTD: HYDROcodone-acetaminophen (NORCO) 7.5-325 MG per tablet    Sig: Take 1 tablet by mouth every 6 (six) hours as needed for pain.    Dispense:  120 tablet    Refill:  2  . HYDROcodone-acetaminophen (NORCO) 7.5-325 MG per tablet    Sig: Take 1 tablet by mouth every 6 (six) hours as needed for pain.    Dispense:  120 tablet    Refill:  2

## 2012-11-06 ENCOUNTER — Telehealth: Payer: Self-pay

## 2012-11-06 NOTE — Telephone Encounter (Signed)
Patient indicates she does not need the hydrocodone. I misunderstood her, she needs the tramadol only. Sorry for the confusion. She will call the pain clinic for the Tramadol

## 2012-11-06 NOTE — Telephone Encounter (Signed)
I refilled her hydrocodone on 10/28/12 at visit; I gave her a hard copy of rx with check out paperwork. Please have her look through her paperwork for rx.

## 2012-11-06 NOTE — Telephone Encounter (Signed)
The pain center would take this over once she is established. I called her to advise. Patient indicates the pain clinic has given her gabapentin and tramadol, but indicated Dr Katrinka Blazing will continue the Vicodin. Have you gotten any correspondence from the pain clinic regarding this? She also indicates she needs a refill on tramadol, but wants to know if Dr Katrinka Blazing will renew this, or if she should call pain clinic.

## 2012-11-06 NOTE — Telephone Encounter (Signed)
Patient advised to call them for Tramadol and Gabapentin she needs renewal on the Hydrocodone.

## 2012-11-06 NOTE — Telephone Encounter (Signed)
Pt has questions for dr Katrinka Blazing in regard to who will be prescribing her meds,the pain center or [place]?   Best phone for pt is [phone]

## 2012-11-06 NOTE — Telephone Encounter (Signed)
Call --- I reviewed consultation note from [facility] Pain Specialist.  Pain specialist MD did state that he recommended that I (PCP) continue prescribing hydrocodone. He did not state who he would recommend prescribing Ultram and Gabapentin.  Please have patient call pain specialist to see what he would recommend for Ultram and Gabapentin.

## 2013-01-07 ENCOUNTER — Other Ambulatory Visit: Payer: Self-pay | Admitting: Family Medicine

## 2013-01-09 NOTE — Telephone Encounter (Signed)
Phoned in.

## 2013-01-09 NOTE — Telephone Encounter (Signed)
Please phone in refill to pharmacy on Alprazolam.

## 2013-01-23 ENCOUNTER — Other Ambulatory Visit: Payer: Self-pay

## 2013-01-23 ENCOUNTER — Encounter: Payer: Self-pay | Admitting: Family Medicine

## 2013-01-23 ENCOUNTER — Ambulatory Visit: Payer: Self-pay | Admitting: Family Medicine

## 2013-01-23 VITALS — BP 156/100 | HR 81 | Temp 97.7°F | Resp 16 | Ht 64.0 in | Wt 278.2 lb

## 2013-01-23 DIAGNOSIS — I10 Essential (primary) hypertension: Secondary | ICD-10-CM

## 2013-01-23 DIAGNOSIS — F32A Depression, unspecified: Secondary | ICD-10-CM

## 2013-01-23 DIAGNOSIS — F341 Dysthymic disorder: Secondary | ICD-10-CM

## 2013-01-23 DIAGNOSIS — M4800 Spinal stenosis, site unspecified: Secondary | ICD-10-CM

## 2013-01-23 DIAGNOSIS — F329 Major depressive disorder, single episode, unspecified: Secondary | ICD-10-CM

## 2013-01-23 DIAGNOSIS — G894 Chronic pain syndrome: Secondary | ICD-10-CM

## 2013-01-23 MED ORDER — HYDROCODONE-ACETAMINOPHEN 7.5-325 MG PO TABS: 1.0000 | ORAL_TABLET | Freq: Four times a day (QID) | ORAL | Status: DC | PRN

## 2013-01-23 MED ORDER — METOPROLOL TARTRATE 50 MG PO TABS: ORAL_TABLET | ORAL | Status: DC

## 2013-01-23 NOTE — Progress Notes (Signed)
Subjective:    Patient ID: Natalie Dawson, female    DOB: March 04, 1976, 37 y.o.   MRN: [id number]  HPI Patient has been out of BP medication for 3 days. Had problem with prescription being incorrectly filled.  "I'm ok." Energy level "sucks." Gaining weight.   Still waiting on [facility] charity care application process.   Neck still hurts. Notices pain in chest,shoulders and both arms some nights. Feels like overuse of muscles. Some nights noticeable, but not bad. Back worse with being on feet for a long time or sitting for a long time.  Has numbness/tingling in arms and legs that comes and goes. Takes 1/2 flexeril every night. No nerve conduction studies have been done. 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 Does not think she has the arm strength she should.   Having 2 migraines a month. Also has headaches associated with neck pain.  Not checking BP at home.   Continues to feel exhausted during the day, knows she snore. Awaiting for charity care, hoping to be able to get sleep study.  Emotionally, things are "good." Taking xanax a couple days a week.   Review of Systems  Constitutional: Positive for fatigue and unexpected weight change. Negative for fever, chills and diaphoresis.  Respiratory: Negative for cough, shortness of breath and stridor.   Cardiovascular: Negative for chest pain, palpitations and leg swelling.  Endocrine: Negative for polydipsia, polyphagia and polyuria.  Musculoskeletal: Positive for back pain, myalgias, neck pain and neck stiffness.  Neurological: Positive for weakness and headaches. Negative for dizziness, tremors, seizures, syncope, facial asymmetry, speech difficulty, light-headedness and numbness.  Psychiatric/Behavioral: Positive for dysphoric mood. Negative for suicidal ideas, sleep disturbance and self-injury. The patient is nervous/anxious.    Past Medical History  Diagnosis Date  . Allergy   . Anxiety   .  Depression   . Hypertension   . Spinal stenosis   . Migraine    Past Surgical History  Procedure Laterality Date  . Wisdom tooth extraction     Allergies  Allergen Reactions  . Penicillins   . Sulfa Antibiotics   . Zithromax [Azithromycin]    Current Outpatient Prescriptions on File Prior to Visit  Medication Sig Dispense Refill  . B Complex-C (B-COMPLEX WITH VITAMIN C) tablet Take 1 tablet by mouth daily.      . fexofenadine-pseudoephedrine (ALLEGRA-D 24) 180-240 MG per 24 hr tablet Take 1 tablet by mouth daily.      . fluticasone (FLONASE) 50 MCG/ACT nasal spray Place 2 sprays into the nose daily.  16 g  6  . Multiple Vitamin (MULTIVITAMIN) tablet Take 1 tablet by mouth daily.      Marland Kitchen omeprazole (PRILOSEC) 20 MG capsule Take 1 capsule (20 mg total) by mouth daily.  90 capsule  1   No current facility-administered medications on file prior to visit.   History   Social History  . Marital Status: Single    Spouse Name: N/A    Number of Children: N/A  . Years of Education: N/A   Occupational History  . Not on file.   Social History Main Topics  . Smoking status: Current Every Day Smoker -- 0.50 packs/day    Types: Cigarettes  . Smokeless tobacco: Not on file  . Alcohol Use: Yes  . Drug Use: No  . Sexual Activity: Yes   Other Topics Concern  . Not on file   Social History Narrative  . No narrative on file  Objective:   Physical Exam  Nursing note and vitals reviewed. Constitutional: She is oriented to person, place, and time. She appears well-developed and well-nourished. No distress.  HENT:  Head: Normocephalic and atraumatic.  Right Ear: External ear normal.  Left Ear: External ear normal.  Nose: Nose normal.  Mouth/Throat: Oropharynx is clear and moist.  Eyes: Conjunctivae and EOM are normal. Pupils are equal, round, and reactive to light.  Neck: Normal range of motion. Neck supple. Carotid bruit is not present. No thyromegaly present.   Cardiovascular: Normal rate, regular rhythm, normal heart sounds and intact distal pulses.  Exam reveals no gallop and no friction rub.   No murmur heard. Pulmonary/Chest: Effort normal and breath sounds normal. She has no wheezes. She has no rales.  Abdominal: Soft. Bowel sounds are normal. She exhibits no distension and no mass. There is no tenderness. There is no rebound and no guarding.  Musculoskeletal:       Right shoulder: Normal.       Left shoulder: Normal.       Cervical back: She exhibits decreased range of motion, tenderness and pain. She exhibits no bony tenderness and no spasm.       Thoracic back: Normal.       Lumbar back: She exhibits decreased range of motion, tenderness and pain. She exhibits no bony tenderness and no spasm.  Lymphadenopathy:    She has no cervical adenopathy.  Neurological: She is alert and oriented to person, place, and time. No cranial nerve deficit.  Skin: Skin is warm and dry. No rash noted. She is not diaphoretic. No erythema. No pallor.  Psychiatric: She has a normal mood and affect. Her behavior is normal.       Assessment & Plan:  Spinal stenosis - Plan: DISCONTINUED: HYDROcodone-acetaminophen (NORCO) 7.5-325 MG per tablet, DISCONTINUED: HYDROcodone-acetaminophen (NORCO) 7.5-325 MG per tablet, DISCONTINUED: HYDROcodone-acetaminophen (NORCO) 7.5-325 MG per tablet  Chronic pain syndrome - Plan: DISCONTINUED: HYDROcodone-acetaminophen (NORCO) 7.5-325 MG per tablet, DISCONTINUED: HYDROcodone-acetaminophen (NORCO) 7.5-325 MG per tablet, DISCONTINUED: HYDROcodone-acetaminophen (NORCO) 7.5-325 MG per tablet  Essential hypertension, benign  Anxiety and depression  1. HTN: controlled; no changes to management. 2.  Anxiety and depression: stable; no changes to management; decreased Xanax use. 3.  Migraines: improved; no change in management. 4.  Spinal stenosis: stable; refill of hydrocodone x 3 provided; continue Tramadol PRN.  S/p pain specialist  consultation; cannot proceed further due to lack of insurance. 5.  Chronic pain syndrome: persistent; continue current regimen with plan to follow-up with pain management once [facility] is effective. 6. Hypersomnolence/fatigue: persistent; associated with obesity; high risk of OSA yet no insurance currently to undergo sleep study.  Meds ordered this encounter  Medications  . DISCONTD: HYDROcodone-acetaminophen (NORCO) 7.5-325 MG per tablet    Sig: Take 1 tablet by mouth every 6 (six) hours as needed for pain.    Dispense:  120 tablet    Refill:  0  . DISCONTD: HYDROcodone-acetaminophen (NORCO) 7.5-325 MG per tablet    Sig: Take 1 tablet by mouth every 6 (six) hours as needed for pain.    Dispense:  120 tablet    Refill:  0    DO NOT FILL UNTIL 02-22-13  . DISCONTD: HYDROcodone-acetaminophen (NORCO) 7.5-325 MG per tablet    Sig: Take 1 tablet by mouth every 6 (six) hours as needed for pain.    Dispense:  120 tablet    Refill:  0    DO NOT FILL UNTIL 03-24-13  Reginia Forts, M.D.  [facility] [address] [place], [place]  [address] [phone] phone [phone] fax

## 2013-02-03 ENCOUNTER — Ambulatory Visit: Payer: Self-pay | Admitting: Family Medicine

## 2013-03-03 ENCOUNTER — Telehealth: Payer: Self-pay

## 2013-03-03 NOTE — Telephone Encounter (Signed)
Patient would like dr Katrinka Blazing or assistant to call her regarding her pain  Medication [phone]

## 2013-03-04 MED ORDER — GABAPENTIN 300 MG PO CAPS: 300.0000 mg | ORAL_CAPSULE | Freq: Three times a day (TID) | ORAL | Status: DC

## 2013-03-04 MED ORDER — TRAMADOL HCL 50 MG PO TABS: 50.0000 mg | ORAL_TABLET | Freq: Four times a day (QID) | ORAL | Status: DC | PRN

## 2013-03-04 NOTE — Telephone Encounter (Signed)
Called her,she states she went to pain clinic and was advised Dr Katrinka Blazing should continue the hydrocodone, he placed her on Tramadol and Neurontin. He was filling these for her, but when she called for the refills, she was told she needed appointment. She is trying to get charity care for the visit. She does not want to see him until she gets the visit covered under charity care. She states she would like for you to fill her Neurontin and Tramadol until she gets the charity care, and the visit with him. I asked her if this will void her contract with the pain clinic, she advised she does not have a contract with them, she has only seen them once.

## 2013-03-04 NOTE — Telephone Encounter (Signed)
Call --- I have received and reviewed the initial consultation from the pain specialist.  I agree to refill her Tramadol and Gabapentin until she receives her [facility]; does she know the status on her charity care?  Please call in refill of Tramadol.

## 2013-03-04 NOTE — Telephone Encounter (Signed)
No, she does not know when the charity care will begin, she indicates she is just now beginning the process. They have not received her application yet.  Called in her tramadol for her.

## 2013-03-19 ENCOUNTER — Other Ambulatory Visit: Payer: Self-pay | Admitting: Family Medicine

## 2013-03-21 IMAGING — US US THYROID
1 series · 17 of 25 positions shown · non-contrast
Comparison: none

REASON FOR EXAM: thyroid nodule
COMMENTS:

[Series 1: us thyroid · 17 of 47 slices shown]
[im 1/47]
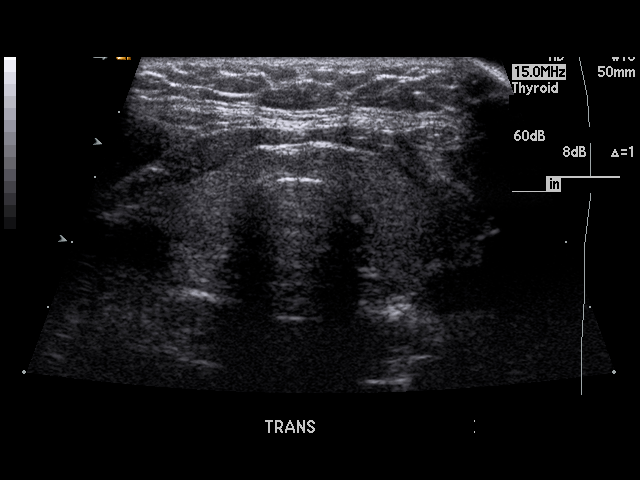
[im 4/47]
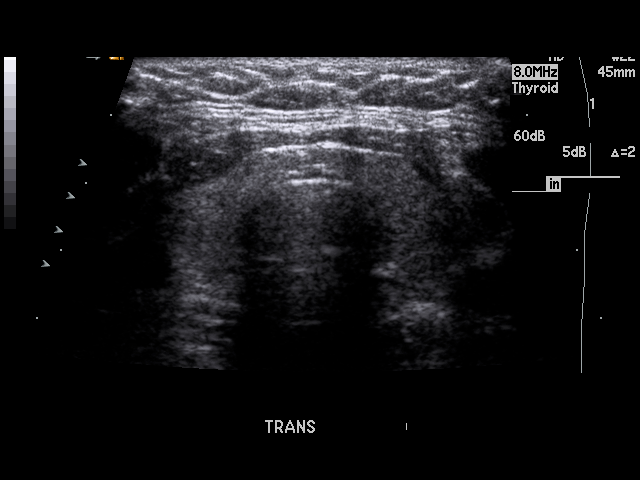
[im 6/47]
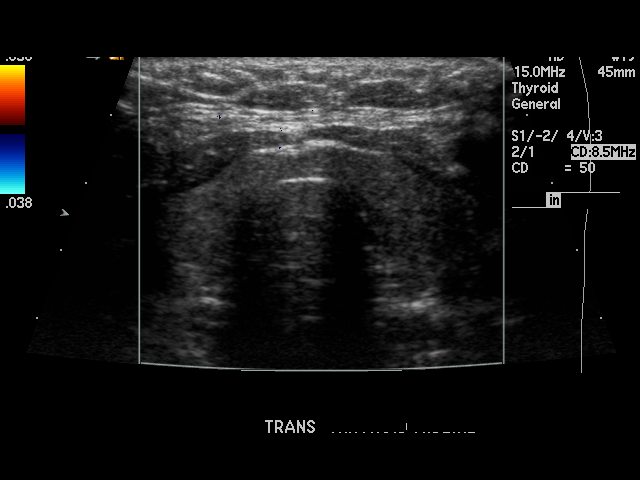
[im 10/47]
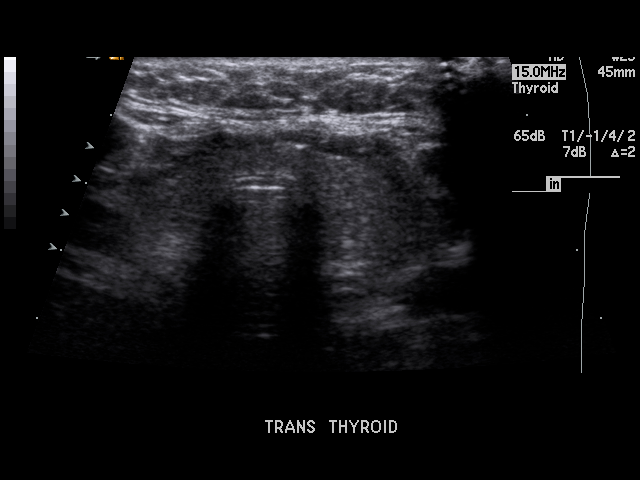
[im 12/47]
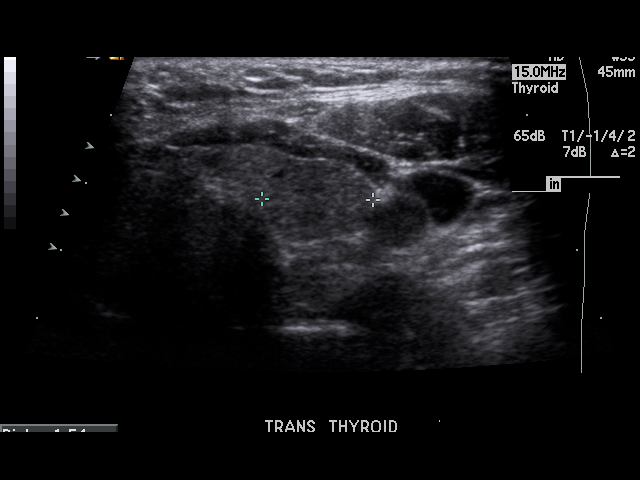
[im 16/47]
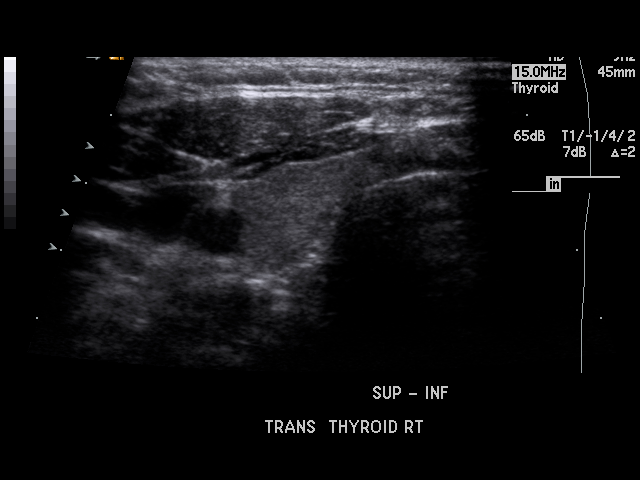
[im 18/47]
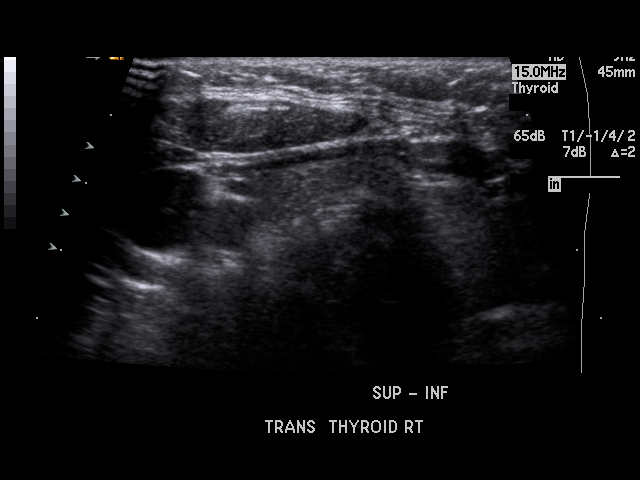
[im 22/47]
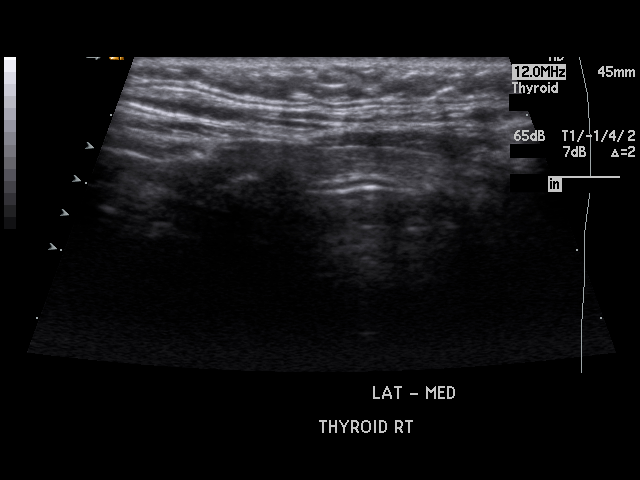
[im 24/47]
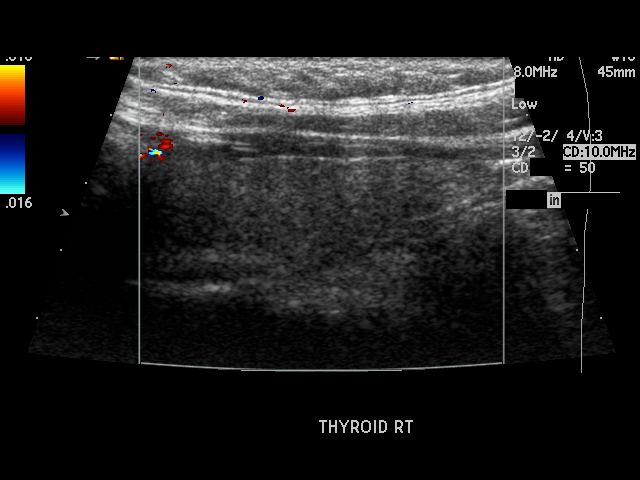
[im 25/47]
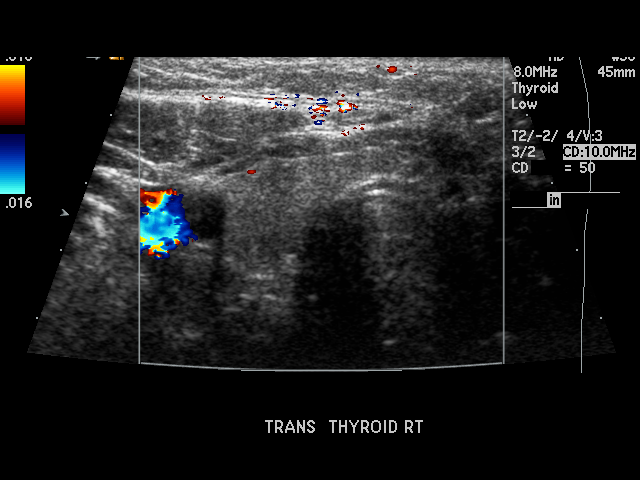
[im 29/47]
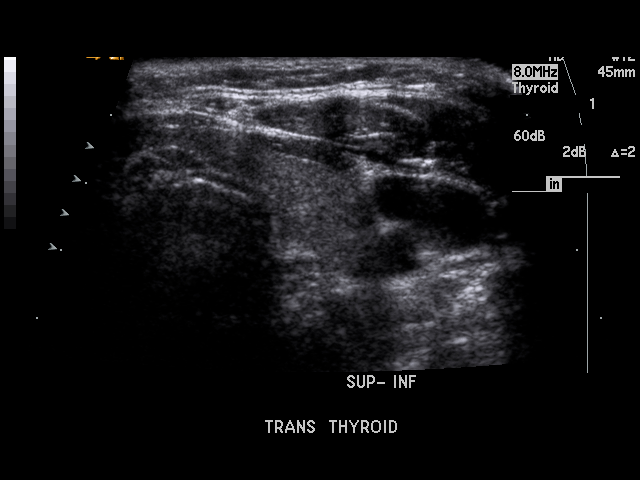
[im 31/47]
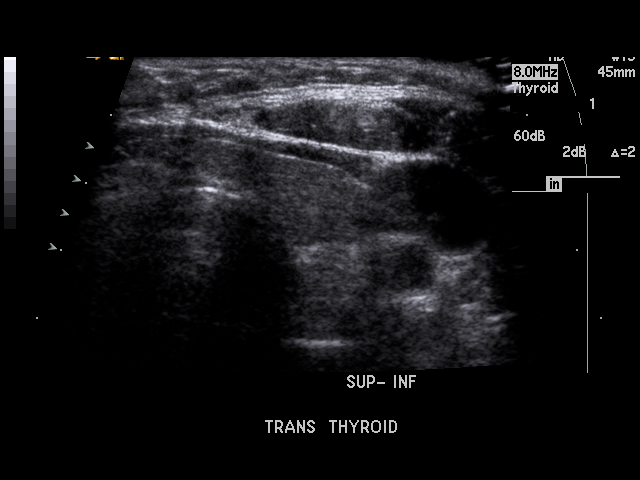
[im 35/47]
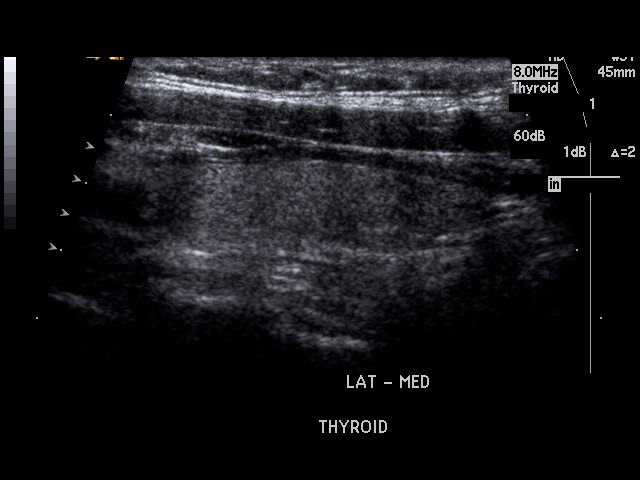
[im 37/47]
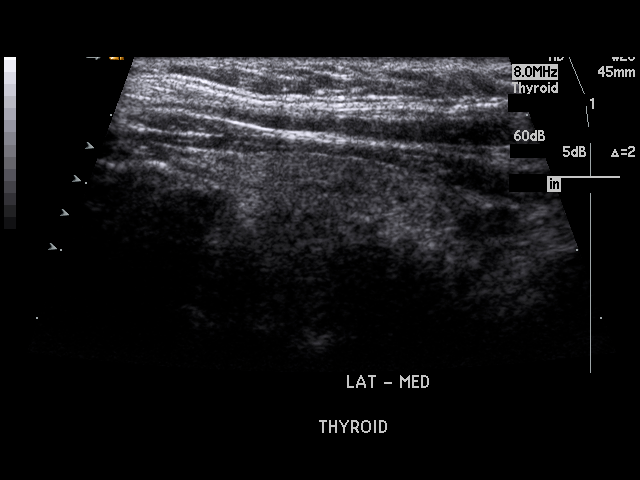
[im 41/47]
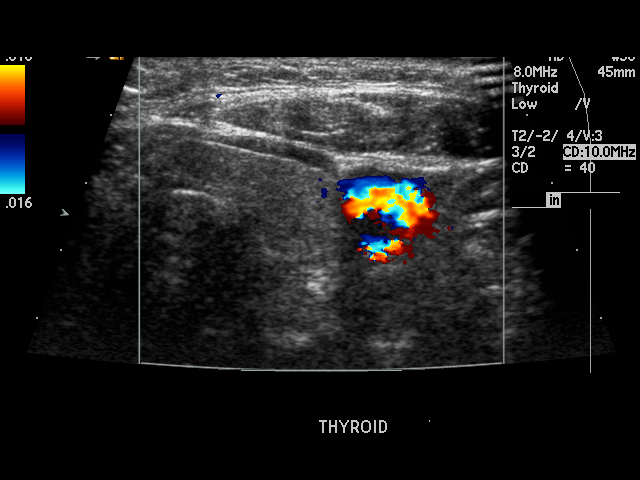
[im 43/47]
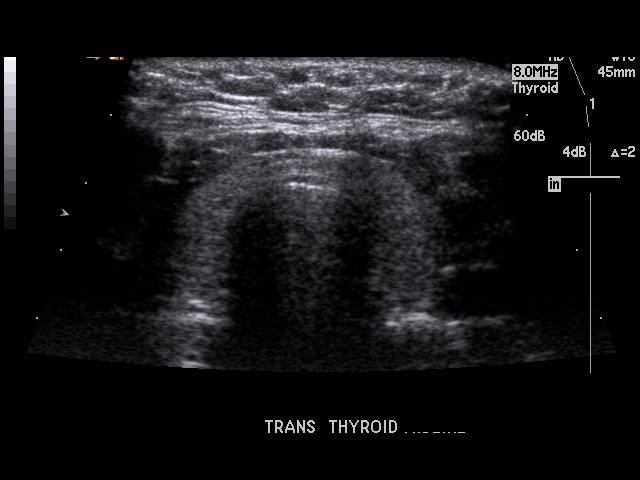
[im 47/47]
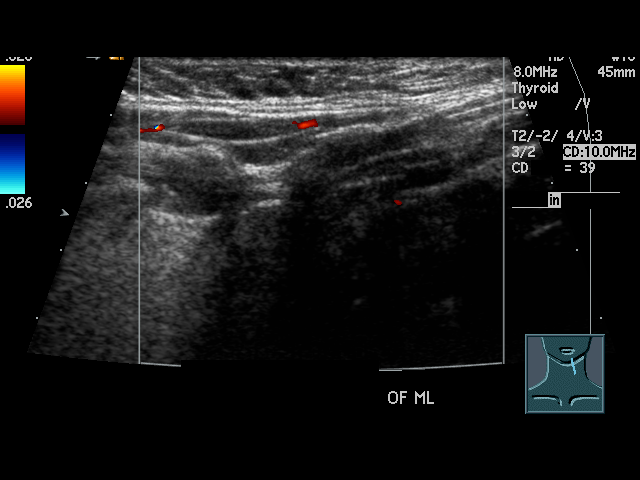

[17 of 25 positions shown; findings below may reference images not displayed]

PROCEDURE:     BETTY - BETTY THYROID  - February 16, 2011  [DATE]

RESULT:     Thyroid ultrasound demonstrates the right lobe measuring 4.0 x
1.45 x 1.56 cm and the left lobe measuring 4.93 x 1.54 x 1.58 cm. The
isthmus demonstrates an anterior posterior dimension of 0.47 cm. Both lobes
demonstrate homogeneous echotexture without solid or cystic mass. No
evidence of calcification is demonstrated.
IMPRESSION: Normal appearing thyroid sonogram. No sonographic
abnormality is seen in the area of palpable concern. CT of the neck may be
beneficial for further assessment.

## 2013-04-22 ENCOUNTER — Encounter: Payer: Self-pay | Admitting: Family Medicine

## 2013-04-22 ENCOUNTER — Ambulatory Visit: Payer: Self-pay | Admitting: Family Medicine

## 2013-04-22 VITALS — BP 116/73 | HR 61 | Temp 98.2°F | Resp 16 | Ht 64.5 in | Wt 289.0 lb

## 2013-04-22 DIAGNOSIS — M4800 Spinal stenosis, site unspecified: Secondary | ICD-10-CM

## 2013-04-22 DIAGNOSIS — F419 Anxiety disorder, unspecified: Principal | ICD-10-CM

## 2013-04-22 DIAGNOSIS — F329 Major depressive disorder, single episode, unspecified: Secondary | ICD-10-CM

## 2013-04-22 DIAGNOSIS — I10 Essential (primary) hypertension: Secondary | ICD-10-CM

## 2013-04-22 DIAGNOSIS — F341 Dysthymic disorder: Secondary | ICD-10-CM

## 2013-04-22 DIAGNOSIS — G894 Chronic pain syndrome: Secondary | ICD-10-CM

## 2013-04-22 DIAGNOSIS — R42 Dizziness and giddiness: Secondary | ICD-10-CM

## 2013-04-22 MED ORDER — HYDROCODONE-ACETAMINOPHEN 7.5-325 MG PO TABS: 1.0000 | ORAL_TABLET | Freq: Four times a day (QID) | ORAL | Status: DC | PRN

## 2013-04-22 MED ORDER — CYCLOBENZAPRINE HCL 5 MG PO TABS: 10.0000 mg | ORAL_TABLET | Freq: Every day | ORAL | Status: DC

## 2013-04-22 NOTE — Progress Notes (Signed)
Subjective:   This chart was scribed for Natalie ChickKristi M Catera Hankins, MD by Arlan OrganAshley Leger, [facility] Scribe. This patient was seen in room 21 and the patient's care was started 2:35 PM.   Patient ID: Natalie Dawson, female    DOB: 1976/01/18, 38 y.o.   MRN: [id number]  HPI  HPI Comments: Natalie SladeShannon Dawson is a 38 y.o. Female with a PMHx of HTN, spinal stenosis, depression, chronic back pain, chronic neck pain, and anxiety who [facility] seeking a three month follow up visit for her chronic back pain today. She reports her pain gradually and progressively worsening over the last 30 days. She states the onset of her pain is intermittent and sometimes sudden. States at times, she needs to sit down for relief from the discomfort. She says the pain is exacerbated by standing and walking  for long periods of time. She denies any pain going into her legs bilaterally, but reports numbness and weakness to her lower extremities along B thighs laterally secondary to her pain. However, pt states she typically is always weak and says the weakness is not necessarily new for her. She also reports constant fatigue daily. She reports feeling unstable on her feet. She denies any bowel or urinary changes, or CP. Unable to exercise due to back pain; did just purchase a bike and is hoping to exercise on bike.  She admits to some carpel tunnel discomfort. She says the pain is improved in the morning time, and experiences more discomfort at night time.   She states her migraines have been typically the same, and reports having to try other prescribed medications for relief. Pt reports no changes in her neck pain.   She admits to 2 dizzy spells about 2 weeks ago, 1 week apart, that lasted for 24 hours each. She states the room was spinning, and felt very "swimmy headed". She says sitting down alleviated her discomfort. She reports nausea and weakness at time of onset of each episode. She  says at that time, she was due for a Neurontin refill. Pt states when she refilled her medication, her symptoms resolved. She denies CP or tinnitus at time of both incidents.  She had been suffering with a URI at time of dizziness.  No recurrence in the past week.  No new medications.  She states she still experiences anxiety, but not as frequently. She states emotionally, she is "up and down" depending on what is going on in her life. She admits to allowing her surroundings to impact her mood. She states currently, her biggest stressor is going back to work full time.  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  She has an interview next week with [employer] in [place].  She would really like to return to the [employer] where her mother works.  She plans to start daily morning workouts with her partner. She has also expressed interest in starting to bike ride to increase her muscle strength.  She is currently taking Ultram 4 times daily, Vicodin 4 times a day, Xanex PRN (about 3-4 times a week), half dosage of Zoloft daily, and flexeril 5mg  once daily/bedtime.  Pt is planning to go back to work soon, and is looking forward to an interview this week with [employer] in [place].  Review of Systems  Constitutional: Positive for fatigue.  HENT: Positive for congestion. Negative for ear pain, sore throat and voice  change.   Eyes: Negative for photophobia and visual disturbance.  Respiratory: Negative for cough and shortness of breath.   Cardiovascular: Negative for chest pain, palpitations and leg swelling.  Gastrointestinal: Positive for nausea. Negative for vomiting, abdominal pain and diarrhea.  Musculoskeletal: Positive for back pain and neck pain.  Neurological: Positive for dizziness, weakness, numbness and headaches. Negative for syncope.  Psychiatric/Behavioral: Positive for dysphoric mood. Negative for suicidal ideas, sleep disturbance and self-injury. The  patient is nervous/anxious.     Past Medical History  Diagnosis Date  . Allergy   . Anxiety   . Depression   . Hypertension   . Spinal stenosis   . Migraine     Past Surgical History  Procedure Laterality Date  . Wisdom tooth extraction      History   Social History  . Marital Status: Single    Spouse Name: N/A    Number of Children: N/A  . Years of Education: N/A   Occupational History  . Not on file.   Social History Main Topics  . Smoking status: Current Every Day Smoker -- 0.50 packs/day    Types: Cigarettes  . Smokeless tobacco: Not on file  . Alcohol Use: Yes  . Drug Use: No  . Sexual Activity: Yes   Other Topics Concern  . Not on file   Social History Narrative  . No narrative on file   Triage Vitals: BP 116/73  Pulse 61  Temp(Src) 98.2 F (36.8 C)  Resp 16  Ht 5' 4.5" (1.638 m)  Wt 289 lb (131.09 kg)  BMI 48.86 kg/m2  SpO2 98%  LMP 03/24/2013   Objective:  Physical Exam  Nursing note and vitals reviewed. Constitutional: She is oriented to person, place, and time. She appears well-developed and well-nourished.  Morbidly obese.  HENT:  Head: Normocephalic.  Right Ear: External ear normal.  Left Ear: External ear normal.  Nose: Nose normal.  Mouth/Throat: Oropharynx is clear and moist and mucous membranes are normal.  Drainage to back of throat Bilateral hearing aids in place  Eyes: Conjunctivae and EOM are normal. Pupils are equal, round, and reactive to light.  Neck: Normal range of motion. Neck supple. No JVD present. No thyromegaly present.  Cardiovascular: Normal rate, regular rhythm and normal heart sounds.  Exam reveals no gallop and no friction rub.   No murmur heard. Pulmonary/Chest: Effort normal and breath sounds normal. She has no wheezes. She has no rales.  Abdominal: She exhibits no distension.  Musculoskeletal:       Right shoulder: Normal.       Left shoulder: Normal.       Cervical back: She exhibits decreased range of  motion, tenderness and pain. She exhibits no bony tenderness and no spasm.       Lumbar back: She exhibits pain. She exhibits normal range of motion, no tenderness, no bony tenderness and no spasm.  Toe and heel walking intact Marching intact  Lymphadenopathy:    She has no cervical adenopathy.  Neurological: She is alert and oriented to person, place, and time. She has normal strength. No cranial nerve deficit or sensory deficit. She exhibits normal muscle tone. Coordination normal.  Reflex Scores:      Patellar reflexes are 2+ on the right side and 2+ on the left side. Skin: Skin is warm and dry.  Psychiatric: She has a normal mood and affect. Her behavior is normal.   Assessment & Plan:  Anxiety and depression  Chronic pain syndrome -  Plan: HYDROcodone-acetaminophen (NORCO) 7.5-325 MG per tablet, DISCONTINUED: HYDROcodone-acetaminophen (NORCO) 7.5-325 MG per tablet, DISCONTINUED: HYDROcodone-acetaminophen (NORCO) 7.5-325 MG per tablet, DISCONTINUED: HYDROcodone-acetaminophen (NORCO) 7.5-325 MG per tablet  Essential hypertension, benign  Spinal stenosis - Plan: HYDROcodone-acetaminophen (NORCO) 7.5-325 MG per tablet, DISCONTINUED: HYDROcodone-acetaminophen (NORCO) 7.5-325 MG per tablet, DISCONTINUED: HYDROcodone-acetaminophen (NORCO) 7.5-325 MG per tablet, DISCONTINUED: HYDROcodone-acetaminophen (NORCO) 7.5-325 MG per tablet  Dizziness   1.  Chronic pain syndrome:  Persistent; s/p pain consultation at [facility]; awaiting [facility].  Refill of Hydrocodone for three months provided; continue Tramadol qid and Flexeril qhs.  Encourage exercise on bicycle. 2.  HTN: controlled; no change in medications. 3.  Anxiety with depression: moderately controlled; continue Zoloft and Xanax.  Taking Xanax qod on average.   4.  Dizziness:  New onset; two days of dizziness; associated with URI.  No recurrence; pt declined labs; if persistent dizziness or recurrence, recommend labs.  Pt agreeable.  Normal  neurological exam in office. No current dizziness.  I personally performed the services described in this documentation, which was scribed in my presence.  The recorded information has been reviewed and is accurate.  Nilda Simmer, M.D.  [facility] [address] [place], [place]  [address] [phone] phone [phone] fax

## 2013-05-05 ENCOUNTER — Other Ambulatory Visit: Payer: Self-pay

## 2013-05-05 MED ORDER — CYCLOBENZAPRINE HCL 5 MG PO TABS: 10.0000 mg | ORAL_TABLET | Freq: Every day | ORAL | Status: DC

## 2013-05-09 ENCOUNTER — Other Ambulatory Visit: Payer: Self-pay | Admitting: Family Medicine

## 2013-05-11 NOTE — Telephone Encounter (Signed)
OK to RF

## 2013-06-19 ENCOUNTER — Other Ambulatory Visit: Payer: Self-pay | Admitting: Family Medicine

## 2013-06-19 NOTE — Telephone Encounter (Signed)
Called in.

## 2013-06-19 NOTE — Telephone Encounter (Signed)
Please call in refill of Xanax as approved. 

## 2013-07-15 ENCOUNTER — Ambulatory Visit: Payer: Self-pay | Admitting: Family Medicine

## 2013-07-20 ENCOUNTER — Encounter: Payer: Self-pay | Admitting: Family Medicine

## 2013-07-20 ENCOUNTER — Ambulatory Visit: Payer: Self-pay | Admitting: Family Medicine

## 2013-07-20 VITALS — BP 108/82 | HR 79 | Temp 98.4°F | Resp 16 | Ht 64.2 in | Wt 286.0 lb

## 2013-07-20 DIAGNOSIS — N926 Irregular menstruation, unspecified: Secondary | ICD-10-CM

## 2013-07-20 DIAGNOSIS — G894 Chronic pain syndrome: Secondary | ICD-10-CM

## 2013-07-20 DIAGNOSIS — F419 Anxiety disorder, unspecified: Secondary | ICD-10-CM

## 2013-07-20 DIAGNOSIS — F32A Depression, unspecified: Secondary | ICD-10-CM

## 2013-07-20 DIAGNOSIS — M4800 Spinal stenosis, site unspecified: Secondary | ICD-10-CM

## 2013-07-20 DIAGNOSIS — F329 Major depressive disorder, single episode, unspecified: Secondary | ICD-10-CM

## 2013-07-20 DIAGNOSIS — Z79899 Other long term (current) drug therapy: Secondary | ICD-10-CM

## 2013-07-20 LAB — POCT URINE PREGNANCY: Preg Test, Ur: NEGATIVE

## 2013-07-20 MED ORDER — FLUOXETINE HCL 20 MG PO TABS: ORAL_TABLET | ORAL | Status: DC

## 2013-07-20 MED ORDER — SERTRALINE HCL 100 MG PO TABS: 150.0000 mg | ORAL_TABLET | Freq: Every day | ORAL | Status: DC

## 2013-07-20 MED ORDER — HYDROCODONE-ACETAMINOPHEN 7.5-325 MG PO TABS: 1.0000 | ORAL_TABLET | Freq: Four times a day (QID) | ORAL | Status: DC | PRN

## 2013-07-20 MED ORDER — CYCLOBENZAPRINE HCL 5 MG PO TABS: 5.0000 mg | ORAL_TABLET | Freq: Three times a day (TID) | ORAL | Status: DC | PRN

## 2013-07-20 MED ORDER — METOPROLOL TARTRATE 50 MG PO TABS: ORAL_TABLET | ORAL | Status: DC

## 2013-07-20 MED ORDER — GABAPENTIN 300 MG PO CAPS: 300.0000 mg | ORAL_CAPSULE | Freq: Three times a day (TID) | ORAL | Status: DC

## 2013-07-20 MED ORDER — TRAMADOL HCL 50 MG PO TABS: 50.0000 mg | ORAL_TABLET | Freq: Four times a day (QID) | ORAL | Status: DC | PRN

## 2013-07-20 NOTE — Patient Instructions (Signed)
1. Wean Zoloft to 1 tablet daily for two weeks and then decrease to 1/2 Zoloft daily for two weeks and then stop. 2. Start Prozac 20mg  one tablet daily for one month; you can increase to two tablets daily.

## 2013-07-20 NOTE — Progress Notes (Signed)
Subjective:    Patient ID: Natalie Dawson, female    DOB: Jul 26, 1975, 38 y.o.   MRN: [id number] This chart was scribed for Natalie ShengKristi M. Katrinka BlazingSmith, MD by Marica OtterNusrat Rahman, ED Scribe. This patient was seen in room 23 and the patient's care was started at 11:38 AM.    HPI HPI Comments: Natalie Dawson is a 38 y.o. female who [facility] complaining of multiple complaints; this is 3 month follow-up on chronic pain syndrome:   Muscle Aches/ Lower Back Pain:  Pt's first complaint is of waxing and waning body aches.   Pt's second complaint is of  intermittent lower back pain that worsens with standing and movement. Pt reports that the pain is alleviated when she sits down. Pt reports she takes her Flexeril as needed (never more than 2 times a day). Pt reports that the Flexeril provides temporary relief to her back spasms.  Reports compliance with hydrocodone and tramadol.    Irregular Menstrual Period: Pt complains of being late on her period. Pt reports that she has her period regularly, however, they have become sporadic in the past few months. Specifically, pt reports she had a menstrual period which began 03/16/13 and ended in the first week of January and did not have another menstrual period until 05/24/13 which lasted until 05/26/13. (pt reports 3 days is normal for her). Pt reports she has not had a period since. Pt denies taking a pregnancy test.   Depression: For her fourth complaint, pt reports that she has been very depressed lately and does not want to be around other people. Pt feels very depressed about not having a job, to the point where she will not shower for a few days. Pt also feels depressed about her body image (although pt reports she is going to a Zumba class this week). Pt further states that she is sleeping for approximately 12 hours a night. Pt believes that others think that she is lazy for not having a job.   Pain Meds: Pt reports she feels like a  "space cadet." She has been taking hydrocodone/Vicodin and tramadol regularly. Pt reports she [facility]. Pt reports she experiences the least amount of pain late evening and at night.   Review of Systems  Constitutional: Positive for fatigue. Negative for fever, chills and diaphoresis.  Genitourinary: Positive for menstrual problem (irregular past 4 months).  Musculoskeletal: Positive for myalgias, back pain (lower back pain), gait problem, neck pain and neck stiffness.  Neurological: Negative for weakness and numbness.  Psychiatric/Behavioral: Positive for dysphoric mood. Negative for suicidal ideas, sleep disturbance and self-injury. The patient is nervous/anxious.    Objective:   Physical Exam  Constitutional: She is oriented to person, place, and time. She appears well-developed and well-nourished. No distress.  Morbidly obese.  HENT:  Head: Normocephalic and atraumatic.  Eyes: Conjunctivae and EOM are normal. Pupils are equal, round, and reactive to light.  Neck: Normal range of motion. Neck supple. No tracheal deviation present.  Cardiovascular: Normal rate, regular rhythm and normal heart sounds.  Exam reveals no gallop and no friction rub.   No murmur heard. Pulmonary/Chest: Effort normal. No respiratory distress. She has no wheezes. She has no rales.  Abdominal: Soft. Bowel sounds are normal. She exhibits no distension and no mass. There is no tenderness. There is no rebound and no guarding.  Musculoskeletal: Normal range of motion.  Right shoulder: Normal.       Left shoulder: Normal.       Cervical back: Normal.       Thoracic back: Normal.       Lumbar back: She exhibits pain. She exhibits normal range of motion, no tenderness, no bony tenderness and no spasm.  Lymphadenopathy:    She has no cervical adenopathy.  Neurological: She is alert and oriented to person, place, and time. No cranial nerve deficit. She exhibits normal  muscle tone. Coordination normal.  Skin: Skin is warm and dry. She is not diaphoretic.  Psychiatric: She has a normal mood and affect. Her behavior is normal. Judgment and thought content normal.  Nursing note and vitals reviewed.   Results for orders placed or performed in visit on 07/20/13  POCT urine pregnancy  Result Value Ref Range   Preg Test, Ur Negative      Assessment & Plan:  Spinal stenosis - Plan: DISCONTINUED: HYDROcodone-acetaminophen (NORCO) 7.5-325 MG per tablet, DISCONTINUED: HYDROcodone-acetaminophen (NORCO) 7.5-325 MG per tablet, DISCONTINUED: HYDROcodone-acetaminophen (NORCO) 7.5-325 MG per tablet  Chronic pain syndrome - Plan: DISCONTINUED: HYDROcodone-acetaminophen (NORCO) 7.5-325 MG per tablet, DISCONTINUED: HYDROcodone-acetaminophen (NORCO) 7.5-325 MG per tablet, DISCONTINUED: HYDROcodone-acetaminophen (NORCO) 7.5-325 MG per tablet  High risk medication use - Plan: POCT urine pregnancy  Irregular menses  Anxiety and depression  1. Spinal stenosis: persistent with worsening pain; [facility]; refills provided at this time of Tramadol and Hydrocodone.  Refill of Neurontin and Flexeril also provided. 2. Chronic pain syndrome: interfering with ability to work and undergo social activities; recommend follow-up with pain management. 3.  Irregular menses: urine pregnancy negative; obesity likely contributing to irregularity. 4.  Depression with anxiety: worsening due to daily pain; wean Zoloft over next month; start Prozac. 5. Hypersomnolence: persistent; pt with likely OSA but cannot afford sleep study.  Highly encourage weight loss. 6. Morbid obesity: persistent; greatly affecting all comorbidities.    Meds ordered this encounter  Medications  . gabapentin (NEURONTIN) 300 MG capsule    Sig: Take 1 capsule (300 mg total) by mouth 3 (three) times daily.    Dispense:  90 capsule    Refill:  5  . DISCONTD:  HYDROcodone-acetaminophen (NORCO) 7.5-325 MG per tablet    Sig: Take 1 tablet by mouth every 6 (six) hours as needed.    Dispense:  120 tablet    Refill:  0    Fill on or after 07/25/13  . DISCONTD: metoprolol (LOPRESSOR) 50 MG tablet    Sig: 1 tablet twice daily    Dispense:  60 tablet    Refill:  11  . DISCONTD: sertraline (ZOLOFT) 100 MG tablet    Sig: Take 1.5 tablets (150 mg total) by mouth daily.    Dispense:  45 tablet    Refill:  5  . DISCONTD: traMADol (ULTRAM) 50 MG tablet    Sig: Take 1 tablet (50 mg total) by mouth every 6 (six) hours as needed.    Dispense:  120 tablet    Refill:  5  . cyclobenzaprine (FLEXERIL) 5 MG tablet    Sig: Take 1 tablet (5 mg total) by mouth 3 (three) times daily as needed for muscle spasms.    Dispense:  30 tablet    Refill:  5  . DISCONTD: HYDROcodone-acetaminophen (NORCO) 7.5-325 MG per tablet    Sig: Take 1 tablet by mouth every 6 (six) hours as needed.    Dispense:  120 tablet  Refill:  0    Fill on or after 08/25/13  . DISCONTD: HYDROcodone-acetaminophen (NORCO) 7.5-325 MG per tablet    Sig: Take 1 tablet by mouth every 6 (six) hours as needed.    Dispense:  120 tablet    Refill:  0    Fill on or after 09/24/13  . DISCONTD: FLUoxetine (PROZAC) 20 MG tablet    Sig: One tablet daily x 1 month then increase to two tablets daily    Dispense:  60 tablet    Refill:  5   I personally performed the services described in this documentation, which was scribed in my presence.  The recorded information has been reviewed and is accurate.  Nilda Simmer, M.D.  [facility] [address] [place], [place]  [address] [phone] phone [phone] fax

## 2013-07-31 ENCOUNTER — Telehealth: Payer: Self-pay

## 2013-07-31 MED ORDER — METOPROLOL TARTRATE 50 MG PO TABS: ORAL_TABLET | ORAL | Status: DC

## 2013-07-31 NOTE — Telephone Encounter (Signed)
Metoprolol refill sent to [facility] in [place].

## 2013-07-31 NOTE — Telephone Encounter (Signed)
SMITH - Pt's pharmacy has been faxing over her blood pressure medicine refill request to your old office in [place].  She just found out today that this was going on and she had it corrected.  She has been without her medicine for two days now and says she can tell a difference.  Says she is starting to get headaches.  Can we please refill this? Call her at [phone] She [facility] [place]

## 2013-07-31 NOTE — Telephone Encounter (Signed)
Dr Katrinka BlazingSmith  Prozac is $75 or $100.  Patient states she will stay on the Zoloft as she can afford it.  Will NOT fill the Prozac script.

## 2013-08-03 ENCOUNTER — Telehealth: Payer: Self-pay

## 2013-08-03 ENCOUNTER — Other Ambulatory Visit: Payer: Self-pay

## 2013-08-03 MED ORDER — METOPROLOL TARTRATE 50 MG PO TABS: ORAL_TABLET | ORAL | Status: DC

## 2013-08-03 NOTE — Telephone Encounter (Signed)
Received request from [facility] [place].  Unable to get fax to go through after three attempts.

## 2013-08-03 NOTE — Telephone Encounter (Signed)
Patient called for RF of BP med.  Checked chart, it was sent to wrong pharmacy on 07/31/2013.  [facility] [place] [facility] [place]. RX called in for Metoprolol per chart.

## 2013-08-07 ENCOUNTER — Telehealth: Payer: Self-pay

## 2013-08-07 ENCOUNTER — Other Ambulatory Visit: Payer: Self-pay | Admitting: Family Medicine

## 2013-08-07 NOTE — Telephone Encounter (Signed)
[facility]. Patient wants to know if [facility] can [facility], please advise.

## 2013-08-07 NOTE — Telephone Encounter (Signed)
Called pt and LMOM that I had [facility] w/[facility] at [facility] [place] on 08/03/13 and given him the Rx over the phone. Asked her to CB if she still has a problem with it after checking w/pharmacy.

## 2013-08-07 NOTE — Telephone Encounter (Signed)
metoprolol (LOPRESSOR) 50 MG tablet Pt needs refill; please call [phone]

## 2013-08-07 NOTE — Telephone Encounter (Addendum)
Pt cb and LM on VM that she has already p/up the metoprolol. She was calling earlier to request RF of tramadol. [facility] w RFs [facility]. I called [facility] and asked them to transfer the refills which they agreed to do. LMOM for pt w/status.

## 2013-08-10 ENCOUNTER — Other Ambulatory Visit: Payer: Self-pay | Admitting: Radiology

## 2013-08-10 ENCOUNTER — Other Ambulatory Visit: Payer: Self-pay | Admitting: Family Medicine

## 2013-08-10 MED ORDER — TRAMADOL HCL 50 MG PO TABS: 50.0000 mg | ORAL_TABLET | Freq: Four times a day (QID) | ORAL | Status: DC | PRN

## 2013-08-10 NOTE — Telephone Encounter (Signed)
Duplicate

## 2013-08-10 NOTE — Telephone Encounter (Signed)
Called in Rx Dr Katrinka BlazingSmith wrote and notified pt on VM

## 2013-10-19 ENCOUNTER — Encounter: Payer: Self-pay | Admitting: Family Medicine

## 2013-10-19 ENCOUNTER — Ambulatory Visit (INDEPENDENT_AMBULATORY_CARE_PROVIDER_SITE_OTHER): Payer: Self-pay | Admitting: Family Medicine

## 2013-10-19 VITALS — BP 120/86 | HR 61 | Temp 98.6°F | Resp 16 | Ht 64.25 in | Wt 290.4 lb

## 2013-10-19 DIAGNOSIS — I10 Essential (primary) hypertension: Secondary | ICD-10-CM

## 2013-10-19 DIAGNOSIS — R5381 Other malaise: Secondary | ICD-10-CM

## 2013-10-19 DIAGNOSIS — M4803 Spinal stenosis, cervicothoracic region: Secondary | ICD-10-CM

## 2013-10-19 DIAGNOSIS — F419 Anxiety disorder, unspecified: Secondary | ICD-10-CM

## 2013-10-19 DIAGNOSIS — M4802 Spinal stenosis, cervical region: Secondary | ICD-10-CM

## 2013-10-19 DIAGNOSIS — R5383 Other fatigue: Secondary | ICD-10-CM

## 2013-10-19 DIAGNOSIS — F329 Major depressive disorder, single episode, unspecified: Secondary | ICD-10-CM

## 2013-10-19 DIAGNOSIS — F341 Dysthymic disorder: Secondary | ICD-10-CM

## 2013-10-19 DIAGNOSIS — G894 Chronic pain syndrome: Secondary | ICD-10-CM

## 2013-10-19 LAB — CBC
HCT: 39.7 % (ref 36.0–46.0)
Hemoglobin: 13.5 g/dL (ref 12.0–15.0)
MCH: 29.7 pg (ref 26.0–34.0)
MCHC: 34 g/dL (ref 30.0–36.0)
MCV: 87.3 fL (ref 78.0–100.0)
PLATELETS: 190 10*3/uL (ref 150–400)
RBC: 4.55 MIL/uL (ref 3.87–5.11)
RDW: 14.4 % (ref 11.5–15.5)
WBC: 8.6 10*3/uL (ref 4.0–10.5)

## 2013-10-19 LAB — POCT URINALYSIS DIPSTICK
Bilirubin, UA: NEGATIVE
Glucose, UA: NEGATIVE
Ketones, UA: NEGATIVE
Leukocytes, UA: NEGATIVE
Nitrite, UA: NEGATIVE
Protein, UA: NEGATIVE
SPEC GRAV UA: 1.02
UROBILINOGEN UA: 0.2
pH, UA: 6.5

## 2013-10-19 MED ORDER — HYDROCODONE-ACETAMINOPHEN 7.5-325 MG PO TABS: 1.0000 | ORAL_TABLET | Freq: Four times a day (QID) | ORAL | Status: DC | PRN

## 2013-10-19 MED ORDER — CITALOPRAM HYDROBROMIDE 20 MG PO TABS: 20.0000 mg | ORAL_TABLET | Freq: Every day | ORAL | Status: DC

## 2013-10-19 NOTE — Progress Notes (Signed)
Subjective:    Patient ID: Natalie GibbsShannon D Brookens, female    DOB: May 20, 1975, 38 y.o.   MRN: [id number]  10/19/2013  Follow-up, Back Pain, Depression and Hypertension   HPI This 38 y.o. female presents for three month follow-up of the following:  1.  Depression: management made at last visit included weaning Zoloft and starting Prozac.  Did not stat Prozac due to expense of $75/month; [facility].  Thinks that called [facility]; no longer on [facility] list.  No motivation to complete tasks.  [facility] and has not completed it.  Needs to file for disability and cannot get motivated to complete and cannot do it.  Not working now.  Still living with mother.  Got in a huge fight with mother; moved in with Rodney/old boyfriend.  Still talking with Kathlene NovemberMike but has not seen him in 3 months.  Thereasa DistanceRodney let pt return; Thereasa DistanceRodney drags pt down.  Decreased motivation is worse with him; Thereasa DistanceRodney is not working either.  His dad and grandmother are paying the bills.  Thereasa DistanceRodney is trying to steel medications from patient.  Has been with Thereasa Distanceodney x 2-3 months.  Has made amends with mother; mother has a drinking problem.  Mother bing drinks on wine.  When mom starts drinking, mother gets mouthy with pt.  Mother constantly bringing up openings at dental school.  Pt unable to walk from parking deck to the office.  Mother just does not get it.   Patient is moving back home this weekend; this week going to be getting most things packed up.  Mother agreeable to have patient return if she puts forth more effort towards charity care, disability.  Dad bought patient a lap top.  Kathlene NovemberMike wants to work things out with patient; married but not divorced.  Kathlene NovemberMike had been in [employer] for six years; Kathlene NovemberMike separated from wife prior to moving to [place] due to wife cheating on W[place].  Kathlene NovemberMike has two daughters.  Kathlene NovemberMike is living in own bedroom with wife.  Moved back in with wife for financial reasons; wife has not worked for several years.  Having  difficulty focusing on a project.  Nothing holds attention long.  Taking Xanax more often than should; almost daily.    2.  Chronic pain syndrome:  Back is no better; pain is no better and no worse.  More bad days than good.  If goes into a store, if on feet more than five minutes, must sit down.   Taking Hydrocodone four daily; Tramadol taking 4 daily.  Neurontin three daily.  Flexeril PRN; does not take every day; only takes if having a really bad day; makes really groggy. Denies radiation into legs; denies n/t/w; in legs.  Denies saddle paresthesias or b/b dysfunction.   3.  HTN:  Not checking BP at home.  Patient reports good compliance with medication, good tolerance to medication, and good symptom control.  Denies exercise; limited due to chronic back pain.  Denies CP/Palp/SOB/leg swelling. Denies dizziness/focal weakness/paresthesias.  4. Irregular menses:  Skipped two months without menses back to back; now monthly menses; had menses 09/26/13.  Had a regular menses early June.     Review of Systems  Constitutional: Negative for fever, chills, diaphoresis and fatigue.  Eyes: Negative for visual disturbance.  Respiratory: Negative for cough and shortness of breath.   Cardiovascular: Negative for chest pain, palpitations and leg swelling.  Gastrointestinal: Negative for nausea, vomiting, abdominal pain, diarrhea and constipation.  Endocrine: Negative for cold intolerance, heat intolerance, polydipsia, polyphagia and polyuria.  Musculoskeletal: Positive for back pain, gait problem, myalgias, neck pain and neck stiffness.  Skin: Negative for rash and wound.  Neurological: Negative for dizziness, tremors, seizures, syncope, facial asymmetry, speech difficulty, weakness, light-headedness, numbness and headaches.  Psychiatric/Behavioral: Positive for sleep disturbance, dysphoric mood and decreased concentration. Negative for suicidal ideas and self-injury. The patient is nervous/anxious.      Past Medical History  Diagnosis Date  . Allergy   . Anxiety   . Depression   . Hypertension   . Spinal stenosis   . Migraine    Past Surgical History  Procedure Laterality Date  . Wisdom tooth extraction     Allergies  Allergen Reactions  . Penicillins   . Sulfa Antibiotics   . Zithromax [Azithromycin]    Current Outpatient Prescriptions  Medication Sig Dispense Refill  . ALPRAZolam (XANAX) 0.5 MG tablet TAKE 1 TABLET AT BEDTIME AS NEEDED  30 tablet  5  . B Complex-C (B-COMPLEX WITH VITAMIN C) tablet Take 1 tablet by mouth daily.      . cyclobenzaprine (FLEXERIL) 5 MG tablet Take 1 tablet (5 mg total) by mouth 3 (three) times daily as needed for muscle spasms.  30 tablet  5  . fexofenadine-pseudoephedrine (ALLEGRA-D 24) 180-240 MG per 24 hr tablet Take 1 tablet by mouth daily.      . fluticasone (FLONASE) 50 MCG/ACT nasal spray Place 2 sprays into the nose daily.  16 g  6  . gabapentin (NEURONTIN) 300 MG capsule Take 1 capsule (300 mg total) by mouth 3 (three) times daily.  90 capsule  5  . HYDROcodone-acetaminophen (NORCO) 7.5-325 MG per tablet Take 1 tablet by mouth every 6 (six) hours as needed for moderate pain.  120 tablet  0  . metoprolol (LOPRESSOR) 50 MG tablet 1 tablet twice daily  60 tablet  2  . Multiple Vitamin (MULTIVITAMIN) tablet Take 1 tablet by mouth daily.      Marland Kitchen omeprazole (PRILOSEC) 20 MG capsule Take 1 capsule (20 mg total) by mouth daily.  90 capsule  1  . promethazine (PHENERGAN) 25 MG tablet TAKE 1 TABLET (25 MG TOTAL) BY MOUTH EVERY 6 (SIX) HOURS AS NEEDED.  60 tablet  3  . sertraline (ZOLOFT) 100 MG tablet Take 1.5 tablets (150 mg total) by mouth daily.  45 tablet  5  . traMADol (ULTRAM) 50 MG tablet Take 1 tablet (50 mg total) by mouth every 6 (six) hours as needed.  120 tablet  5  . citalopram (CELEXA) 20 MG tablet Take 1 tablet (20 mg total) by mouth daily.  30 tablet  5  . FLUoxetine (PROZAC) 20 MG tablet One tablet daily x 1 month then increase  to two tablets daily  60 tablet  5  . HYDROcodone-acetaminophen (NORCO) 7.5-325 MG per tablet Take 1 tablet by mouth every 6 (six) hours as needed.  120 tablet  0   No current facility-administered medications for this visit.   History   Social History  . Marital Status: Single    Spouse Name: N/A    Number of Children: N/A  . Years of Education: N/A   Occupational History  . Not on file.   Social History Main Topics  . Smoking status: Current Every Day Smoker -- 0.50 packs/day    Types: Cigarettes  . Smokeless tobacco: Not on file  . Alcohol Use: Yes  . Drug Use: No  . Sexual Activity: Yes  Other Topics Concern  . Not on file   Social History Narrative  . No narrative on file   Family History  Problem Relation Age of Onset  . Depression Mother   . Hyperlipidemia Mother   . Anxiety disorder Mother   . Diabetes Father   . Hypertension Father   . Cancer Maternal Grandmother   . Cancer Maternal Grandfather   . Heart attack Maternal Grandfather   . Cancer Paternal Grandmother   . Alzheimer's disease Paternal Grandmother   . Cancer Paternal Grandfather        Objective:    BP 120/86  Pulse 61  Temp(Src) 98.6 F (37 C) (Oral)  Resp 16  Ht 5' 4.25" (1.632 m)  Wt 290 lb 6.4 oz (131.725 kg)  BMI 49.46 kg/m2  SpO2 97%  LMP 09/26/2013 Physical Exam  Constitutional: She is oriented to person, place, and time. She appears well-developed and well-nourished. No distress.  Morbidly obese.  HENT:  Head: Normocephalic and atraumatic.  Right Ear: External ear normal.  Left Ear: External ear normal.  Nose: Nose normal.  Mouth/Throat: Oropharynx is clear and moist.  Eyes: Conjunctivae and EOM are normal. Pupils are equal, round, and reactive to light.  Neck: Normal range of motion. Neck supple. Carotid bruit is not present. No thyromegaly present.  Cardiovascular: Normal rate, regular rhythm, normal heart sounds and intact distal pulses.  Exam reveals no gallop and no  friction rub.   No murmur heard. Pulmonary/Chest: Effort normal and breath sounds normal. She has no wheezes. She has no rales.  Abdominal: Soft. Bowel sounds are normal. She exhibits no distension and no mass. There is no tenderness. There is no rebound and no guarding.  Musculoskeletal:       Cervical back: She exhibits tenderness and pain. She exhibits normal range of motion and no bony tenderness.       Lumbar back: She exhibits tenderness and pain. She exhibits normal range of motion, no bony tenderness and no spasm.  Lymphadenopathy:    She has no cervical adenopathy.  Neurological: She is alert and oriented to person, place, and time. No cranial nerve deficit. She exhibits normal muscle tone. Coordination normal.  Skin: Skin is warm and dry. No rash noted. She is not diaphoretic. No erythema. No pallor.  Psychiatric: She has a normal mood and affect. Her behavior is normal.   Results for orders placed in visit on 10/19/13  CBC      Result Value Ref Range   WBC 8.6  4.0 - 10.5 K/uL   RBC 4.55  3.87 - 5.11 MIL/uL   Hemoglobin 13.5  12.0 - 15.0 g/dL   HCT 16.1  09.6 - 04.5 %   MCV 87.3  78.0 - 100.0 fL   MCH 29.7  26.0 - 34.0 pg   MCHC 34.0  30.0 - 36.0 g/dL   RDW 40.9  81.1 - 91.4 %   Platelets 190  150 - 400 K/uL  COMPREHENSIVE METABOLIC PANEL      Result Value Ref Range   Sodium 140  135 - 145 mEq/L   Potassium 4.2  3.5 - 5.3 mEq/L   Chloride 102  96 - 112 mEq/L   CO2 27  19 - 32 mEq/L   Glucose, Bld 87  70 - 99 mg/dL   BUN 7  6 - 23 mg/dL   Creat 7.82  9.56 - 2.13 mg/dL   Total Bilirubin 0.3  0.2 - 1.2 mg/dL   Alkaline Phosphatase  68  39 - 117 U/L   AST 25  0 - 37 U/L   ALT 20  0 - 35 U/L   Total Protein 6.6  6.0 - 8.3 g/dL   Albumin 3.9  3.5 - 5.2 g/dL   Calcium 8.5  8.4 - 16.1 mg/dL  POCT URINALYSIS DIPSTICK      Result Value Ref Range   Color, UA yellow     Clarity, UA clear     Glucose, UA neg     Bilirubin, UA neg     Ketones, UA neg     Spec Grav, UA  1.020     Blood, UA trace     pH, UA 6.5     Protein, UA neg     Urobilinogen, UA 0.2     Nitrite, UA neg     Leukocytes, UA Negative         Assessment & Plan:   1. Essential hypertension, benign   2. Spinal stenosis of cervicothoracic region   3. Chronic pain syndrome   4. Other malaise and fatigue   5. Anxiety and depression    1. HTN: controlled; obtain labs; refill proivded. 2.  Cervical-thoracic spinal stenosis: stable; refill of hydrocodone provided. 3.  Chronic pain syndrome: persistent; secondary to cervical and lumbar pain; refill of hydrocodone provided. 4.  Malaise and fatigue: persistent; highly concerned with OSA due to morbid obesity; pt cannot afford sleep study; interfering with ability to work or exercise.   5.  Anxiety and depression: worsening due to chronic pain; wean Zoloft and try Citalopram; cannot afford Prozac.  Discourage daily use of Xanax.   6. Lower back pain: chronic issue; obesity definitely contributing to ongoing pain. 7. Obesity: worsening; contributing to current symptoms; pt aware and desires to work on weight loss.   Meds ordered this encounter  Medications  . DISCONTD: HYDROcodone-acetaminophen (NORCO) 7.5-325 MG per tablet    Sig: Take 1 tablet by mouth every 6 (six) hours as needed for moderate pain.    Dispense:  120 tablet    Refill:  0    Fill on or after 06/25/13  . citalopram (CELEXA) 20 MG tablet    Sig: Take 1 tablet (20 mg total) by mouth daily.    Dispense:  30 tablet    Refill:  5  . HYDROcodone-acetaminophen (NORCO) 7.5-325 MG per tablet    Sig: Take 1 tablet by mouth every 6 (six) hours as needed.    Dispense:  120 tablet    Refill:  0    Fill on or after 11/25/13  . HYDROcodone-acetaminophen (NORCO) 7.5-325 MG per tablet    Sig: Take 1 tablet by mouth every 6 (six) hours as needed for moderate pain.    Dispense:  120 tablet    Refill:  0    Fill on or after 12/25/13    Return in about 3 months (around 01/19/2014) for  recheck.    Nilda Simmer, M.D.  [facility] [address] [place], [place]  [address] [phone] phone [phone] fax

## 2013-10-19 NOTE — Patient Instructions (Signed)
1. Decrease Sertraline 100mg  to one tablet daily for one week and then decrease to 1/2 tablet daily for one week and then stop. 2.  Start Citalopram 20mg  one tablet daily now.

## 2013-10-20 LAB — COMPREHENSIVE METABOLIC PANEL
ALT: 20 U/L (ref 0–35)
AST: 25 U/L (ref 0–37)
Albumin: 3.9 g/dL (ref 3.5–5.2)
Alkaline Phosphatase: 68 U/L (ref 39–117)
BILIRUBIN TOTAL: 0.3 mg/dL (ref 0.2–1.2)
BUN: 7 mg/dL (ref 6–23)
CO2: 27 mEq/L (ref 19–32)
CREATININE: 0.65 mg/dL (ref 0.50–1.10)
Calcium: 8.5 mg/dL (ref 8.4–10.5)
Chloride: 102 mEq/L (ref 96–112)
Glucose, Bld: 87 mg/dL (ref 70–99)
Potassium: 4.2 mEq/L (ref 3.5–5.3)
Sodium: 140 mEq/L (ref 135–145)
Total Protein: 6.6 g/dL (ref 6.0–8.3)

## 2013-10-22 ENCOUNTER — Telehealth: Payer: Self-pay

## 2013-10-22 NOTE — Telephone Encounter (Signed)
Patient left voicemail on Wednesday at 4:03pm requesting copies of medical records to apply for disability. Will need to sign a release form. Medical records needs to call patient. Cb# [phone]

## 2013-10-25 ENCOUNTER — Encounter: Payer: Self-pay | Admitting: Family Medicine

## 2013-10-30 NOTE — Telephone Encounter (Signed)
Called and left message with patient advising her to fill out the release form in our office or on our web site. Advised her to call back if she had any questions.

## 2013-12-02 ENCOUNTER — Other Ambulatory Visit: Payer: Self-pay | Admitting: Family Medicine

## 2013-12-04 NOTE — Telephone Encounter (Signed)
Called in.

## 2013-12-04 NOTE — Telephone Encounter (Signed)
Please call in refill for Xanax as approved. 

## 2013-12-15 ENCOUNTER — Telehealth: Payer: Self-pay

## 2013-12-15 NOTE — Telephone Encounter (Signed)
Patient returned call to medical records. Simply stated that she wants her medical records mailed to her home instead of her coming to office to pick them up.   [phone]

## 2013-12-17 NOTE — Telephone Encounter (Signed)
Records mailed

## 2014-01-18 ENCOUNTER — Encounter: Payer: Self-pay | Admitting: Family Medicine

## 2014-01-18 ENCOUNTER — Ambulatory Visit (INDEPENDENT_AMBULATORY_CARE_PROVIDER_SITE_OTHER): Payer: Self-pay | Admitting: Family Medicine

## 2014-01-18 VITALS — BP 125/79 | HR 77 | Temp 98.3°F | Resp 16 | Ht 64.5 in | Wt 287.2 lb

## 2014-01-18 DIAGNOSIS — F329 Major depressive disorder, single episode, unspecified: Secondary | ICD-10-CM

## 2014-01-18 DIAGNOSIS — F418 Other specified anxiety disorders: Secondary | ICD-10-CM

## 2014-01-18 DIAGNOSIS — G894 Chronic pain syndrome: Secondary | ICD-10-CM

## 2014-01-18 DIAGNOSIS — M4803 Spinal stenosis, cervicothoracic region: Secondary | ICD-10-CM

## 2014-01-18 DIAGNOSIS — R110 Nausea: Secondary | ICD-10-CM

## 2014-01-18 DIAGNOSIS — F419 Anxiety disorder, unspecified: Secondary | ICD-10-CM

## 2014-01-18 DIAGNOSIS — F32A Depression, unspecified: Secondary | ICD-10-CM

## 2014-01-18 MED ORDER — HYDROCODONE-ACETAMINOPHEN 7.5-325 MG PO TABS: 1.0000 | ORAL_TABLET | Freq: Four times a day (QID) | ORAL | Status: DC | PRN

## 2014-01-18 MED ORDER — CITALOPRAM HYDROBROMIDE 20 MG PO TABS: 30.0000 mg | ORAL_TABLET | Freq: Every day | ORAL | Status: DC

## 2014-01-18 NOTE — Progress Notes (Signed)
Subjective:    Patient ID: Natalie Dawson, female    DOB: [birth year], 38 y.o.   MRN: [id number] This chart was scribed for Nilda SimmerKristi Sadiyah Kangas, MD by Jolene Provostobert Halas, Medical Scribe. This patient was seen in Room 22 and the patient's care was started at 4:19 PM.  HPI HPI Comments: Natalie Dawson is a 38 y.o. female with a hx of depression and HTN who [facility]. Pt states she is feeling better. She states she likes her celexa depression medication. Pt states she still has anxiety but it feels manageable. Pt states she has more good days than bad days. Pt states she has ongoing and worsening lower back pain. Pt states she has had [facility], and would like to be referred to someone else. Pt states it has been one year since she was [facility]. Pt states she has difficulty working out or walking for long periods due to her back pain. Pt states she is interested in [facility], in the pain clinic. Pt is taking tramadol 4 times per day as well as hydrocodone 4 times per day for her back pain with some relief.  Pt is a current smoker, .5 PPD. Pt states she takes xanax 6-7 times per week for anxiety. Pt states that zoloft leaves her in a fog, and she does not like it. Pt states she is not dating regularly. Pt states she has been aggravated with the men she was dating previously. Pt recently had a tooth pulled.    Pt also states that when she gets up in the morning she experiences lower abdominal pain and pressure. Pt also states when she reaches toward her lower abdomen she feels a pulling sensation and pain in ger groin.  Pt states she is applying for disability. Pt states she does not want to be on disability, but due to her back pain she is unable to work.    Review of Systems  Constitutional: Negative for fever and chills.  Musculoskeletal: Positive for back pain.       Right ankle  swelling  Skin: Negative for rash.  Neurological: Positive for numbness.       Numbness and tingling in right leg.  Psychiatric/Behavioral: Negative for suicidal ideas.       Objective:   Physical Exam  Nursing note and vitals reviewed. Constitutional: She is oriented to person, place, and time. She appears well-developed and well-nourished.  HENT:  Head: Normocephalic and atraumatic.  Eyes: Pupils are equal, round, and reactive to light.  Neck: No JVD present.  ROM reduced.   Cardiovascular: Normal rate and regular rhythm.   Pulmonary/Chest: Effort normal and breath sounds normal. No respiratory distress.  Neurological: She is alert and oriented to person, place, and time.  Skin: Skin is warm and dry.  Psychiatric: She has a normal mood and affect. Her behavior is normal.       Assessment & Plan:  Chronic pain syndrome - Plan: Ambulatory referral to Pain Clinic, HYDROcodone-acetaminophen (NORCO) 7.5-325 MG per tablet, DISCONTINUED: HYDROcodone-acetaminophen (NORCO) 7.5-325 MG per tablet, DISCONTINUED: HYDROcodone-acetaminophen (NORCO) 7.5-325 MG per tablet  Spinal stenosis of cervicothoracic region - Plan: HYDROcodone-acetaminophen (NORCO) 7.5-325 MG per tablet, DISCONTINUED: HYDROcodone-acetaminophen (NORCO) 7.5-325 MG per tablet, DISCONTINUED: HYDROcodone-acetaminophen (NORCO) 7.5-325 MG per tablet  Nausea without vomiting  Anxiety and depression  Meds ordered this encounter  Medications  .  citalopram (CELEXA) 20 MG tablet    Sig: Take 1.5 tablets (30 mg total) by mouth daily.    Dispense:  45 tablet    Refill:  5  . DISCONTD: HYDROcodone-acetaminophen (NORCO) 7.5-325 MG per tablet    Sig: Take 1 tablet by mouth every 6 (six) hours as needed.    Dispense:  120 tablet    Refill:  0  . DISCONTD: HYDROcodone-acetaminophen (NORCO) 7.5-325 MG per tablet    Sig: Take 1 tablet by mouth every 6 (six) hours as needed.    Dispense:  120 tablet    Refill:  0    DO NOT FILL  UNTIL 02-18-2014.  Marland Kitchen. HYDROcodone-acetaminophen (NORCO) 7.5-325 MG per tablet    Sig: Take 1 tablet by mouth every 6 (six) hours as needed.    Dispense:  120 tablet    Refill:  0    DO NOT FILL UNTIL 03-20-2014.   I personally performed the services described in this documentation, which was scribed in my presence.  The recorded information has been reviewed and is accurate.  Nilda SimmerKristi Deanthony Maull, M.D.  [facility]  [place] [address] [place], [place]  [id number] [phone] phone [phone] fax

## 2014-01-26 ENCOUNTER — Other Ambulatory Visit: Payer: Self-pay | Admitting: Family Medicine

## 2014-01-26 NOTE — Telephone Encounter (Signed)
Dr. Katrinka BlazingSmith, you just saw pt and Rxd hydrocodone while waiting on pain clinic referral. Do you want to RF tramadol too?

## 2014-01-27 ENCOUNTER — Other Ambulatory Visit: Payer: Self-pay | Admitting: Family Medicine

## 2014-01-27 NOTE — Telephone Encounter (Signed)
Called in.

## 2014-01-27 NOTE — Telephone Encounter (Signed)
Please call in refill of tramadol as approved.  Pt takes both hydrocodone and tramadol.

## 2014-02-26 ENCOUNTER — Telehealth: Payer: Self-pay | Admitting: Family Medicine

## 2014-02-28 ENCOUNTER — Other Ambulatory Visit: Payer: Self-pay | Admitting: Family Medicine

## 2014-02-28 NOTE — Telephone Encounter (Signed)
1. Please call in refill for Tramadol to pharmacy.  2.  Call patient --- when is or was appointment with pain clinic?

## 2014-03-01 NOTE — Telephone Encounter (Signed)
Called in Rx. Left detailed message for pt [facility] and when appt w/pain clinic was/is.

## 2014-03-08 NOTE — Telephone Encounter (Signed)
Pt never CB. Tried to reach pt again and had to leave another message.

## 2014-03-10 NOTE — Telephone Encounter (Signed)
Pt CB and reported that she was not able to [facility] [place] [facility] bc as a self pay pt she was going to have to pay $400 for the first visit and then $250 every visit after that. Pt could not afford that even w/her father's help. [facility] and has appt sch for Feb 8 w/ Dr Sherwood GamblerLoundtree. She will be in to see Dr Katrinka BlazingSmith before that and would like for her to write a letter or note in OV notes asking explaining why she wants her to go back to pain clinic and that she needs medication for migraines as well as the back pain. Pt stated she thinks that the inc in celexa is helping.

## 2014-03-10 NOTE — Telephone Encounter (Signed)
Noted  

## 2014-03-30 ENCOUNTER — Other Ambulatory Visit: Payer: Self-pay | Admitting: Family Medicine

## 2014-03-31 ENCOUNTER — Other Ambulatory Visit: Payer: Self-pay | Admitting: Radiology

## 2014-03-31 NOTE — Telephone Encounter (Signed)
Called in Tramadol.

## 2014-03-31 NOTE — Telephone Encounter (Signed)
Please fax or call in refill.

## 2014-03-31 NOTE — Telephone Encounter (Signed)
Called in.

## 2014-04-14 ENCOUNTER — Encounter: Payer: Self-pay | Admitting: Family Medicine

## 2014-04-14 ENCOUNTER — Ambulatory Visit (INDEPENDENT_AMBULATORY_CARE_PROVIDER_SITE_OTHER): Payer: Self-pay | Admitting: Family Medicine

## 2014-04-14 VITALS — BP 120/84 | HR 66 | Temp 98.1°F | Resp 16 | Ht 64.25 in | Wt 279.8 lb

## 2014-04-14 DIAGNOSIS — M47812 Spondylosis without myelopathy or radiculopathy, cervical region: Secondary | ICD-10-CM

## 2014-04-14 DIAGNOSIS — R3915 Urgency of urination: Secondary | ICD-10-CM

## 2014-04-14 DIAGNOSIS — G4710 Hypersomnia, unspecified: Secondary | ICD-10-CM

## 2014-04-14 DIAGNOSIS — M47816 Spondylosis without myelopathy or radiculopathy, lumbar region: Secondary | ICD-10-CM

## 2014-04-14 DIAGNOSIS — F329 Major depressive disorder, single episode, unspecified: Secondary | ICD-10-CM

## 2014-04-14 DIAGNOSIS — M25471 Effusion, right ankle: Secondary | ICD-10-CM

## 2014-04-14 DIAGNOSIS — I10 Essential (primary) hypertension: Secondary | ICD-10-CM

## 2014-04-14 DIAGNOSIS — G894 Chronic pain syndrome: Secondary | ICD-10-CM

## 2014-04-14 DIAGNOSIS — F419 Anxiety disorder, unspecified: Secondary | ICD-10-CM

## 2014-04-14 DIAGNOSIS — M4803 Spinal stenosis, cervicothoracic region: Secondary | ICD-10-CM

## 2014-04-14 DIAGNOSIS — F418 Other specified anxiety disorders: Secondary | ICD-10-CM

## 2014-04-14 LAB — POCT URINALYSIS DIPSTICK
BILIRUBIN UA: NEGATIVE
Glucose, UA: NEGATIVE
KETONES UA: NEGATIVE
LEUKOCYTES UA: NEGATIVE
NITRITE UA: NEGATIVE
PROTEIN UA: NEGATIVE
RBC UA: NEGATIVE
Spec Grav, UA: 1.015
Urobilinogen, UA: 0.2
pH, UA: 7.5

## 2014-04-14 LAB — POCT URINE PREGNANCY: PREG TEST UR: NEGATIVE

## 2014-04-14 MED ORDER — HYDROCODONE-ACETAMINOPHEN 7.5-325 MG PO TABS: 1.0000 | ORAL_TABLET | Freq: Four times a day (QID) | ORAL | Status: DC | PRN

## 2014-04-14 MED ORDER — TRAMADOL HCL 50 MG PO TABS: 50.0000 mg | ORAL_TABLET | Freq: Four times a day (QID) | ORAL | Status: DC | PRN

## 2014-04-14 MED ORDER — CITALOPRAM HYDROBROMIDE 40 MG PO TABS: 40.0000 mg | ORAL_TABLET | Freq: Every day | ORAL | Status: DC

## 2014-04-14 NOTE — Patient Instructions (Signed)
Social Anxiety Disorder Social anxiety disorder, previously called social phobia, is a mental disorder. People with social anxiety disorder frequently feel nervous, afraid, or embarrassed when around other people in social situations. They constantly worry that other people are judging or criticizing them for how they look, what they say, or how they act. They may worry that other people might reject them because of their appearance or behavior. Social anxiety disorder is more than just occasional shyness or self-consciousness. It can cause severe emotional distress. It can interfere with daily life activities. Social anxiety disorder also may lead to excessive alcohol or drug use and even suicide.  Social anxiety disorder is actually one of the most common mental disorders. It can develop at any time but usually starts in the teenage years. Women are more commonly affected than men. Social anxiety disorder is also more common in people who have family members with anxiety disorders. It also is more common in people who have physical deformities or conditions with characteristics that are obvious to others, such as stuttered speech or movement abnormalities (Parkinson disease).  SYMPTOMS  In addition to feeling anxious or fearful in social situations, people with social anxiety disorder frequently have physical symptoms. Examples include:  Red face (blushing).  Racing heart.  Sweating.  Shaky hands or voice.  Confusion.  Light-headedness.  Upset stomach and diarrhea. DIAGNOSIS  Social anxiety disorder is diagnosed through an assessment by your health care provider. Your health care provider will ask you questions about your mood, thoughts, and reactions in social situations. Your health care provider may ask you about your medical history and use of alcohol or drugs, including prescription medicines. Certain medical conditions and the use of certain substances, including caffeine, can cause  symptoms similar to social anxiety disorder. Your health care provider may refer you to a mental health specialist for further evaluation or treatment. The criteria for diagnosis of social anxiety disorder are:  Marked fear or anxiety in one or more social situations in which you may be closely watched or studied by others. Examples of such situations include:  Interacting socially (having a conversation with others, going to a party, or meeting strangers).  Being observed (eating or drinking in public or being called on in class).  Performing in front of others (giving a speech).  The social situations of concern almost always cause fear or anxiety, not just occasionally.  People with social anxiety disorder fear that they will be viewed negatively in a way that will be embarrassing, will lead to rejection, or will offend others. This fear is out of proportion to the actual threat posed by the social situation.  Often the triggering social situations are avoided, or they are endured with intense fear or anxiety. The fear, anxiety, or avoidance is persistent and lasts for 6 months or longer.  The anxiety causes difficulty functioning in at least some parts of your daily life. TREATMENT  Several types of treatment are available for social anxiety disorder. These treatments are often used in combination and include:   Talk therapy. Group talk therapy allows you to see that you are not alone with these problems. Individual talk therapy helps you address your specific anxiety issues with a caring professional. The most effective forms of talk therapy for social anxiety disorder are cognitive-behavioral therapy and exposure therapy. Cognitive-behavioral therapy helps you to identify and change negative thoughts and beliefs that are at the root of the disorder. Exposure therapy allows you to gradually face the situations   that you fear most.  Relaxation and coping techniques. These include deep  breathing, self-talk, meditation, visual imagery, and yoga. Relaxation techniques help to keep you calm in social situations.  Social skills training.Social skills can be learned on your own or with the help of a talk therapist. They can help you feel more confident and comfortable in social situations.  Medicine. For anxiety limited to performance situations (performance anxiety), medicine called beta blockers can help by reducing or preventing the physical symptoms of social anxiety disorder. For more persistent and generalized social anxiety, antidepressant medicine may be prescribed to help control symptoms. In severe cases of social anxiety disorder, strong antianxiety medicine, called benzodiazepines, may be prescribed on a limited basis and for a short time. Document Released: 02/08/2005 Document Revised: 07/27/2013 Document Reviewed: 06/10/2012 Exit[employer] Patient Information 2015 [employer]. This information is not intended to replace advice given to you by your health care provider. Make sure you discuss any questions you have with your health care provider.  

## 2014-04-14 NOTE — Progress Notes (Signed)
Subjective:    Patient ID: Natalie Dawson, female    DOB: [birth year], 39 y.o.   MRN: [id number]  04/14/2014  Follow-up; Depression; Chronic pain syndrome; and right anke   HPI This 39 y.o. female presents for three month follow-up:  1. Chronic pain syndrome:  Could not afford Dr. Carmell Austria at [facility] due to $450 up front cost and each office visit $250 per follow-up visit.  Scheduled with [facility] on 05/03/14.  Has dropped the ball regarding repeating [facility] application; needs to get another [facility].   Has appointment 05/24/14 with SS to have help with application; overwhelmed with application process.  Needs assistance.  Will need to reschedule appointment with SS due to [employer] going on sale that morning.  Wants Dad to go with her.   May reschedule appointment with [facility] to see Dr. Fernanda Drum; really wants to get [facility] before seeing pain management.   Currently taking:   Trying not to procrastinate like pt has been doing for the past two years.  2.  Anxiety and depression:  Has returned back to Rod[place]'s house; dating Thereasa Distance again; living with Thereasa Distance; he is trying to work; Thereasa Distance is not drinking heavily now.  Thereasa Distance is trying to work but is still an issue; working one day per week at [employer].  Also has another lady he is working with once per week; he owes her money so working for free.  Pt having car trouble; Dad got car fixed; car broke down in middle of 87 at 5:30pm.   Knows that pain specialist will want pt to have injections and PT; pt unable to afford PT.  Large expenses with car; needs transmission. Feeling better mentally.  Really likes Citalopram.  Zoloft was causing pt to be in a fog; not as tired.  Still sleeping 12 hours per day.   Waking up twice per night to use the bathroom. Feels like has social anxiety.  Did not attend Rodney's family Christmas events. Did not want to be around a lot of people; did not desire to be around family.  Does  not like the chaos.  Not hanging around friends.  Improved motivation yet isolating self still.  Trying to get things done.   Does not like all the questions that come with interacting with friends, family. Taking 1.5 Celexa daily; no side effects.    3. Urge incontinence:   1.5 week duration; occurred one month ago.  Awoke in middle of night; urinated all over self before getting to bathroom. Happened again the following night.  Bathroom is right next to bed.  Even when awake, urgency during the day.  Had to really focus on holding urine to get to bathroom.  Spontaneously resolved.  Lower pelvic pressure has resolved now that complained of last visit.  During that time, no apparent worsening back pain or saddle paresthesias.  No dysuria, frequency.  Having mild urgency still.      4.  R ankle swelling:  Onset two years ago; with prolonged standing, ankle would develop lateral ankle swelling.  Pain with swelling only at end of day with ambulation to car.  Now, not working and at home most days, minimal pain at rest.  Only hurts with swelling.  No injury in past.    5. HTN: Patient reports good compliance with medication, good tolerance to medication, and good symptom control.     Review of Systems  Constitutional: Negative for fever, chills, diaphoresis and  fatigue.  Eyes: Negative for visual disturbance.  Respiratory: Negative for cough and shortness of breath.   Cardiovascular: Negative for chest pain, palpitations and leg swelling.  Gastrointestinal: Negative for nausea, vomiting, abdominal pain, diarrhea and constipation.  Endocrine: Negative for cold intolerance, heat intolerance, polydipsia, polyphagia and polyuria.  Genitourinary: Positive for urgency and menstrual problem. Negative for dysuria, frequency, hematuria, flank pain, vaginal bleeding, vaginal discharge, enuresis, genital sores, vaginal pain and pelvic pain.  Musculoskeletal: Positive for myalgias, back pain, gait problem, neck  pain and neck stiffness.  Neurological: Negative for dizziness, tremors, seizures, syncope, facial asymmetry, speech difficulty, weakness, light-headedness, numbness and headaches.  Psychiatric/Behavioral: Negative for suicidal ideas, sleep disturbance, self-injury and dysphoric mood. The patient is nervous/anxious.     Past Medical History  Diagnosis Date  . Allergy   . Anxiety   . Depression   . Hypertension   . Spinal stenosis   . Migraine   . Chronic pain disorder   . Cervical spondylosis without myelopathy     diagnosed by [facility] 2013.  Marland Kitchen Spondylosis of lumbar region without myelopathy or radiculopathy     diagnosed by [facility] 2013.   Past Surgical History  Procedure Laterality Date  . Wisdom tooth extraction     Allergies  Allergen Reactions  . Penicillins   . Sulfa Antibiotics   . Zithromax [Azithromycin]    Current Outpatient Prescriptions  Medication Sig Dispense Refill  . ALPRAZolam (XANAX) 0.5 MG tablet TAKE 1 TABLET AT BEDTIME AS NEEDED 30 tablet 5  . B Complex-C (B-COMPLEX WITH VITAMIN C) tablet Take 1 tablet by mouth daily.    . cyclobenzaprine (FLEXERIL) 5 MG tablet Take 1 tablet (5 mg total) by mouth 3 (three) times daily as needed for muscle spasms. 30 tablet 5  . fexofenadine-pseudoephedrine (ALLEGRA-D 24) 180-240 MG per 24 hr tablet Take 1 tablet by mouth daily.    . fluticasone (FLONASE) 50 MCG/ACT nasal spray Place 2 sprays into the nose daily. 16 g 6  . gabapentin (NEURONTIN) 300 MG capsule Take 1 capsule (300 mg total) by mouth 3 (three) times daily. 90 capsule 5  . HYDROcodone-acetaminophen (NORCO) 7.5-325 MG per tablet Take 1 tablet by mouth every 6 (six) hours as needed. 120 tablet 0  . metoprolol (LOPRESSOR) 50 MG tablet 1 tablet twice daily 60 tablet 2  . Multiple Vitamin (MULTIVITAMIN) tablet Take 1 tablet by mouth daily.    Marland Kitchen omeprazole (PRILOSEC) 20 MG capsule Take 1 capsule (20 mg total) by mouth daily. 90 capsule 1  .  promethazine (PHENERGAN) 25 MG tablet TAKE 1 TABLET (25 MG TOTAL) BY MOUTH EVERY 6 (SIX) HOURS AS NEEDED. 60 tablet 3  . traMADol (ULTRAM) 50 MG tablet Take 1 tablet (50 mg total) by mouth every 6 (six) hours as needed. 120 tablet 2  . citalopram (CELEXA) 40 MG tablet Take 1 tablet (40 mg total) by mouth daily. 30 tablet 5   No current facility-administered medications for this visit.       Objective:    BP 120/84 mmHg  Pulse 66  Temp(Src) 98.1 F (36.7 C) (Oral)  Resp 16  Ht 5' 4.25" (1.632 m)  Wt 279 lb 12.8 oz (126.916 kg)  BMI 47.65 kg/m2  SpO2 96%  LMP 02/22/2014 Physical Exam  Constitutional: She is oriented to person, place, and time. She appears well-developed and well-nourished. No distress.  Morbidly obese.  HENT:  Head: Normocephalic and atraumatic.  Right Ear: External ear normal.  Left Ear: External ear normal.  Nose: Nose normal.  Mouth/Throat: Oropharynx is clear and moist.  Eyes: Conjunctivae and EOM are normal. Pupils are equal, round, and reactive to light.  Neck: Normal range of motion. Neck supple. Carotid bruit is not present. No thyromegaly present.  Cardiovascular: Normal rate, regular rhythm, normal heart sounds and intact distal pulses.  Exam reveals no gallop and no friction rub.   No murmur heard. Pulmonary/Chest: Effort normal and breath sounds normal. She has no wheezes. She has no rales.  Abdominal: Soft. Bowel sounds are normal. She exhibits no distension and no mass. There is no tenderness. There is no rebound and no guarding.  Musculoskeletal:       Right ankle: She exhibits swelling. She exhibits normal range of motion, no deformity, no laceration and normal pulse. Tenderness. Lateral malleolus tenderness found. No medial malleolus, no head of 5th metatarsal and no proximal fibula tenderness found. Achilles tendon normal. Achilles tendon exhibits no pain, no defect and normal Thompson's test results.       Lumbar back: She exhibits pain. She  exhibits normal range of motion, no spasm and normal pulse.       Right foot: There is normal range of motion, no tenderness and no bony tenderness.  Lumbar spine:  Straight leg raises negative B; toe and heel walking intact; marching intact; motor 5/5 BLE.  Full ROM lumbar spine without limitation. Cervical spine: full ROM cervical spine without limitation.  Motor 5/5 BUE.  Grip 5/5. Changes positions from examination table to standing with ease.  Lays supine without difficulties or limitations. R ankle: +mild swelling lateral aspect; mild TTP R lateral ankle; full ROM.    Lymphadenopathy:    She has no cervical adenopathy.  Neurological: She is alert and oriented to person, place, and time. No cranial nerve deficit.  Skin: Skin is warm and dry. No rash noted. She is not diaphoretic. No erythema. No pallor.  Psychiatric: She has a normal mood and affect. Her behavior is normal.   Results for orders placed or performed in visit on 04/14/14  POCT urinalysis dipstick  Result Value Ref Range   Color, UA yellow    Clarity, UA clear    Glucose, UA neg    Bilirubin, UA neg    Ketones, UA neg    Spec Grav, UA 1.015    Blood, UA neg    pH, UA 7.5    Protein, UA neg    Urobilinogen, UA 0.2    Nitrite, UA neg    Leukocytes, UA Negative   POCT urine pregnancy  Result Value Ref Range   Preg Test, Ur Negative        Assessment & Plan:   1. Urinary urgency   2. Anxiety and depression   3. Spinal stenosis of cervicothoracic region   4. Chronic pain syndrome   5. Right ankle swelling   6. Essential hypertension, benign   7. Hypersomnolence   8. Cervical spondylosis without myelopathy   9. Spondylosis of lumbar region without myelopathy or radiculopathy     1. Urinary urgency: New.  Normal u/a and negative urine pregnancy.  Much improved at this time.  RTC if worsens. 2.  Anxiety and depression: improved depression with increase in motivation with switch to Citalopram  daily.  Suffering with increased anxiety; increase Citalopram to  daily at this time; continue Xanax PRN with goal to discontinue Xanax in future due to other high risk medications. 3.  Spinal stenosis: with  persistent pain; [facility].  4.  Chronic pain syndrome: with moderate pain control; pt feels that she needs improved pain control; [facility]. Refills for hydrocodone and Tramadol provided at this time.  Will not increase dose of medication.   5. R ankle swelling: chronic; no previous injury; feel swelling likely due to venous stasis due to morbid obesity; pt declined xray today due to expense.  Recommend elevation of foot when possible; consider compression stockings if worsens. 6. HTN: controlled.  No changes in management. 7. Hypersomnolence: persistent; [facility], will warrant immediate sleep study.    Meds ordered this encounter  Medications  . citalopram (CELEXA) 40 MG tablet    Sig: Take 1 tablet (40 mg total) by mouth daily.    Dispense:  30 tablet    Refill:  5  . DISCONTD: HYDROcodone-acetaminophen (NORCO) 7.5-325 MG per tablet    Sig: Take 1 tablet by mouth every 6 (six) hours as needed.    Dispense:  120 tablet    Refill:  0    DO NOT FILL UNTIL 04-20-2014.  Marland Kitchen. DISCONTD: HYDROcodone-acetaminophen (NORCO) 7.5-325 MG per tablet    Sig: Take 1 tablet by mouth every 6 (six) hours as needed.    Dispense:  120 tablet    Refill:  0    DO NOT FILL UNTIL 05-21-2014.  Marland Kitchen. HYDROcodone-acetaminophen (NORCO) 7.5-325 MG per tablet    Sig: Take 1 tablet by mouth every 6 (six) hours as needed.    Dispense:  120 tablet    Refill:  0    DO NOT FILL UNTIL 06-19-2014.  . traMADol (ULTRAM) 50 MG tablet    Sig: Take 1 tablet (50 mg total) by mouth every 6 (six) hours as needed.    Dispense:  120 tablet    Refill:  2    Return in about 3 months (around 07/14/2014) for recheck.    Rangel Echeverri Paulita FujitaMartin Yesly Gerety,  M.D. [facility]  [place] [address] [place], [place]  [id number] [phone] phone [phone] fax

## 2014-04-15 ENCOUNTER — Encounter: Payer: Self-pay | Admitting: Family Medicine

## 2014-04-15 DIAGNOSIS — M47816 Spondylosis without myelopathy or radiculopathy, lumbar region: Secondary | ICD-10-CM | POA: Insufficient documentation

## 2014-04-15 DIAGNOSIS — M47812 Spondylosis without myelopathy or radiculopathy, cervical region: Secondary | ICD-10-CM | POA: Insufficient documentation

## 2014-05-20 ENCOUNTER — Other Ambulatory Visit: Payer: Self-pay | Admitting: Family Medicine

## 2014-05-21 NOTE — Telephone Encounter (Signed)
Please call in refill of Alprazolam to pharmacy. 

## 2014-05-21 NOTE — Telephone Encounter (Signed)
Called in.

## 2014-05-26 ENCOUNTER — Other Ambulatory Visit: Payer: Self-pay | Admitting: Family Medicine

## 2014-05-29 NOTE — Telephone Encounter (Signed)
Please call in refill for Tramadol.

## 2014-06-24 ENCOUNTER — Other Ambulatory Visit: Payer: Self-pay | Admitting: Family Medicine

## 2014-07-02 DIAGNOSIS — Z0271 Encounter for disability determination: Secondary | ICD-10-CM

## 2014-07-12 ENCOUNTER — Ambulatory Visit (INDEPENDENT_AMBULATORY_CARE_PROVIDER_SITE_OTHER): Payer: Self-pay | Admitting: Family Medicine

## 2014-07-12 ENCOUNTER — Encounter: Payer: Self-pay | Admitting: Family Medicine

## 2014-07-12 VITALS — BP 138/98 | HR 75 | Temp 98.0°F | Resp 16 | Ht 64.5 in | Wt 274.2 lb

## 2014-07-12 DIAGNOSIS — F418 Other specified anxiety disorders: Secondary | ICD-10-CM

## 2014-07-12 DIAGNOSIS — F419 Anxiety disorder, unspecified: Secondary | ICD-10-CM

## 2014-07-12 DIAGNOSIS — G43709 Chronic migraine without aura, not intractable, without status migrainosus: Secondary | ICD-10-CM

## 2014-07-12 DIAGNOSIS — G4710 Hypersomnia, unspecified: Secondary | ICD-10-CM

## 2014-07-12 DIAGNOSIS — N3941 Urge incontinence: Secondary | ICD-10-CM

## 2014-07-12 DIAGNOSIS — M4803 Spinal stenosis, cervicothoracic region: Secondary | ICD-10-CM

## 2014-07-12 DIAGNOSIS — J0101 Acute recurrent maxillary sinusitis: Secondary | ICD-10-CM

## 2014-07-12 DIAGNOSIS — F329 Major depressive disorder, single episode, unspecified: Secondary | ICD-10-CM

## 2014-07-12 DIAGNOSIS — G894 Chronic pain syndrome: Secondary | ICD-10-CM

## 2014-07-12 MED ORDER — HYDROCODONE-ACETAMINOPHEN 7.5-325 MG PO TABS: 1.0000 | ORAL_TABLET | Freq: Four times a day (QID) | ORAL | Status: DC | PRN

## 2014-07-12 MED ORDER — CEPHALEXIN 500 MG PO CAPS: 500.0000 mg | ORAL_CAPSULE | Freq: Three times a day (TID) | ORAL | Status: DC

## 2014-07-12 MED ORDER — SUMATRIPTAN SUCCINATE 100 MG PO TABS
100.0000 mg | ORAL_TABLET | Freq: Once | ORAL | Status: DC
Start: 2014-07-12 — End: 2015-10-04

## 2014-07-12 NOTE — Progress Notes (Signed)
Subjective:    Patient ID: Natalie Dawson, female    DOB: 1976/03/21, 39 y.o.   MRN: [id number]  07/12/2014  Follow-up; Sinus Problem; Depression; Headache; Hypertension; and Back Pain   HPI This 39 y.o. female presents for three month follow-up:  1.  Chronic pain syndrome:  Has not completed [facility] application; canceled appointment with [facility] because mother talked to patient.  Mother questioned if pt really wanted to try more pain medication if can benefit from injections and PT.  Pt decided to do that but must get charity care.   2.  Lower back pain:  Filed for disability on 04/23/14.   Leaning on shopping cart helps.  Doing laundry or washing dishes, lower back pain is horrible.  If sits down, pain improves.  Then can get back up but pain recurs in a few minutes.  Exhausting.    3.  Anxiety and depression: increased Citalopram to  daily at last visit.  Stepmother  upset patient before appointment.  Father in [place] [facility]; on HD for ESRD.  Lots of health problems; pt takes after father.  Has similar personality as father.  Absences have been issue to working for patient.  Father is stubborn just as pt is.  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  Family believes that nothing is wrong is patient.  Mom knows that patient is in pain. Stepmother had to drive because his health is so [place].  Trying to cut negative people out of life.  Bethann Berkshire is really negative; friend since elementary school; miserable in marriage.  CAnnot be around [place].  No recent visiting.  Thereasa Distance has finally realized that he needs medication for depression; he is doing better; working more; still days where he is struggling. 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  Plans to do [employer] business; has made good money selling clothes on [employer].  Motivation is good; so tired.   Sleeping 12-14 hours per day.  Boyfriend cannot get patient out of bed in the morning.  Will finally get up.  Anxiety has improved since last visit.  Also trying to stay away from negative people.    4.  HTN:  Patient reports good compliance with medication, good tolerance to medication, and good symptom control.  Weight is slowly going down. Has cut back on portions; cutting back on fatty foods.  Drinking splenda cool aid.  Drinking less sodas.     5.  Sinus congestion: sinus congestion; taking Allegra D and Flonase. Coughing at night; dry mouth.  No fever/chills/sweats.    6. Urinary urgency: still having urgency when going to bathroom; not as severe; if has accident, it is at night; does not wet bed but when trying to get to bathroom, urinating on self.  Keeping towel by bed.  Can be side effect of spinal stenosis so not sure if related to spinal stenosis.  If feels like needs to go, must go immediately.  7. Numbness in arms, legs, hands, feet: occurs at nighttime; also having sporadic numbness during the day while awake.  Occurs mostly when sitting down.     6.  Obesity: highest weight 290 in 12/2012.  Now weight down 16 pounds.    Tramadol one refill.  [facility]ills[facility]  [facility] Phen[facility]gan no refills[facility]  [facility]ol 3 [facility]ills [facility] Gabapentin 1 refill  [facility]ram [facility]. Cyclobenzabrine 3 [facility].   Review of Systems  Constitutional: Positive for fatigue. Negative for fever, chills and diaphoresis.  HENT: Positive for congestion, postnasal drip, rhinorrhea and sneezing. Negative for ear discharge, ear pain and sore throat.   Eyes: Negative for visual disturbance.  Respiratory: Negative for cough and shortness of breath.   Cardiovascular: Negative for chest pain, palpitations and leg swelling.  Gastrointestinal: Negative for nausea, vomiting, abdominal pain, diarrhea and constipation.  Endocrine: Negative for cold intolerance, heat intolerance, polydipsia,  polyphagia and polyuria.  Genitourinary: Positive for urgency and frequency. Negative for dysuria and flank pain.  Musculoskeletal: Positive for back pain, arthralgias and gait problem.  Skin: Negative for color change, pallor, rash and wound.  Neurological: Negative for dizziness, tremors, seizures, syncope, facial asymmetry, speech difficulty, weakness, light-headedness, numbness and headaches.  Psychiatric/Behavioral: Positive for dysphoric mood. Negative for suicidal ideas, sleep disturbance and self-injury. The patient is nervous/anxious.     Past Medical History  Diagnosis Date  . Allergy   . Anxiety   . Depression   . Hypertension   . Spinal stenosis   . Migraine   . Chronic pain disorder   . Cervical spondylosis without myelopathy     diagnosed by [facility] 2013.  Marland Kitchen Spondylosis of lumbar region without myelopathy or radiculopathy     diagnosed by [facility] 2013.   Past Surgical History  Procedure Laterality Date  . Wisdom tooth extraction     Allergies  Allergen Reactions  . Penicillins   . Sulfa Antibiotics   . Zithromax [Azithromycin]    Current Outpatient Prescriptions  Medication Sig Dispense Refill  . ALPRAZolam (XANAX) 0.5 MG tablet TAKE 1 TABLET AT BEDTIME AS NEEDED 30 tablet 5  . B Complex-C (B-COMPLEX WITH VITAMIN C) tablet Take 1 tablet by mouth daily.    . citalopram (CELEXA) 40 MG tablet Take 1 tablet (40 mg total) by mouth daily. 30 tablet 5  . cyclobenzaprine (FLEXERIL) 5 MG tablet TAKE ONE TABLET BY MOUTH THREE TIMES DAILY AS NEEDED FOR MUSCLE SPASM 30 tablet 3  . fexofenadine-pseudoephedrine (ALLEGRA-D 24) 180-240 MG per 24 hr tablet Take 1 tablet by mouth daily.    . fluticasone (FLONASE) 50 MCG/ACT nasal spray Place 2 sprays into the nose daily. 16 g 6  . gabapentin (NEURONTIN) 300 MG capsule Take 1 capsule (300 mg total) by mouth 3 (three) times daily. 90 capsule 5  . HYDROcodone-acetaminophen (NORCO) 7.5-325 MG per tablet Take 1  tablet by mouth every 6 (six) hours as needed. 120 tablet 0  . metoprolol (LOPRESSOR) 50 MG tablet 1 tablet twice daily 60 tablet 2  . Multiple Vitamin (MULTIVITAMIN) tablet Take 1 tablet by mouth daily.    Marland Kitchen omeprazole (PRILOSEC) 20 MG capsule Take 1 capsule (20 mg total) by mouth daily. 90 capsule 1  . promethazine (PHENERGAN) 25 MG tablet TAKE 1 TABLET (25 MG TOTAL) BY MOUTH EVERY 6 (SIX) HOURS AS NEEDED. 60 tablet 3  . traMADol (ULTRAM) 50 MG tablet TAKE 1 TABLET BY MOUTH EVERY 6 HRS AS NEEDED 120 tablet 2  . cephALEXin (KEFLEX) 500 MG capsule Take 1 capsule (500 mg total) by mouth 3 (three) times daily. 30 capsule 0  . SUMAtriptan (IMITREX) 100 MG tablet Take 1 tablet (100 mg total) by mouth once. May repeat in 2 hours if headache persists or recurs. 9 tablet 5   No current facility-administered medications for this visit.  Objective:    BP 138/98 mmHg  Pulse 75  Temp(Src) 98 F (36.7 C) (Oral)  Resp 16  Ht 5' 4.5" (1.638 m)  Wt 274 lb 3.2 oz (124.376 kg)  BMI 46.36 kg/m2  SpO2 97%  LMP 05/13/2014 Physical Exam  Constitutional: She is oriented to person, place, and time. She appears well-developed and well-nourished. No distress.  obese  HENT:  Head: Normocephalic and atraumatic.  Right Ear: External ear normal.  Left Ear: External ear normal.  Nose: Mucosal edema and rhinorrhea present. Right sinus exhibits maxillary sinus tenderness. Right sinus exhibits no frontal sinus tenderness. Left sinus exhibits maxillary sinus tenderness. Left sinus exhibits no frontal sinus tenderness.  Mouth/Throat: Oropharynx is clear and moist.  Eyes: Conjunctivae and EOM are normal. Pupils are equal, round, and reactive to light.  Neck: Normal range of motion. Neck supple. Carotid bruit is not present. No thyromegaly present.  Cardiovascular: Normal rate, regular rhythm, normal heart sounds and intact distal pulses.  Exam reveals no gallop and no friction rub.   No murmur  heard. Pulmonary/Chest: Effort normal and breath sounds normal. She has no wheezes. She has no rales.  Abdominal: Soft. Bowel sounds are normal. She exhibits no distension and no mass. There is no tenderness. There is no rebound and no guarding.  Musculoskeletal:       Cervical back: She exhibits pain. She exhibits normal range of motion, no tenderness and no bony tenderness.       Lumbar back: She exhibits decreased range of motion and pain. She exhibits no tenderness, no bony tenderness and no spasm.  Cervical spine: non-tender midline; non-tender paraspinal regions B; full ROM cervical spine without limitation.  Motor 5/5 BUE.  Grip 5/5. Lumbar spine:  Non-tender midline; non-tender paraspinal regions B.  Straight leg raises negative B; toe and heel walking intact; marching intact; motor 5/5 BLE.  Full ROM lumbar spine without limitation.   Lymphadenopathy:    She has no cervical adenopathy.  Neurological: She is alert and oriented to person, place, and time. No cranial nerve deficit. She exhibits normal muscle tone. Coordination normal.  Skin: Skin is warm and dry. No rash noted. She is not diaphoretic. No erythema. No pallor.  Psychiatric: She has a normal mood and affect. Her behavior is normal.   Results for orders placed or performed in visit on 04/14/14  POCT urinalysis dipstick  Result Value Ref Range   Color, UA yellow    Clarity, UA clear    Glucose, UA neg    Bilirubin, UA neg    Ketones, UA neg    Spec Grav, UA 1.015    Blood, UA neg    pH, UA 7.5    Protein, UA neg    Urobilinogen, UA 0.2    Nitrite, UA neg    Leukocytes, UA Negative   POCT urine pregnancy  Result Value Ref Range   Preg Test, Ur Negative        Assessment & Plan:   1. Spinal stenosis of cervicothoracic region   2. Chronic pain syndrome   3. Acute recurrent maxillary sinusitis   4. Anxiety and depression   5. Hypersomnolence   6. Morbid obesity   7. Chronic migraine without aura without  status migrainosus, not intractable   8. Urge incontinence of urine     1. Chronic pain syndrome: moderately controlled; refill of hydrocodone 7.5mg  for three months provided; continue Tramadol, Gabapentin, Flexeril.  Highly highly encourage patient to apply for [facility] so that she can receive injections and physical therapy; pt has been non-compliant with applying for this financial support.  She has applied for disability however. 2.  Spinal stenosis: persistent; having persistent pain.   3.  Acute maxillary sinusitis: New. Rx for Keflex provided; continue Allegra nd Flonase. 4.  Anxiety and depression: improved with Citalopram 40mg  daily; motivation has greatly improved; anxiety has improved.  Wean Xanax soon due to multiple high risk medications. 5.  Hypersomnolence: worsening; really feel that patient suffers with OSA yet cannot afford sleep study; also on multiple sedating medications that is also contributing.  Highly highly encourage her to apply for [facility] yet has been non-compliant with this recommendation. 6.  Morbid obesity: improving slowly; patient is working on diet. 7. Migraines: stable; agreeable to rx for Imitrex. 8.  Urge Incontinence: recurrent yet not as severe; chronic pain is likely contributing; obesity is contributing; excessive caffeine intake is contributing.  Recommend follow-up with ortho/pain management to rule out association with spinal stenosis.   Meds ordered this encounter  Medications  . DISCONTD: HYDROcodone-acetaminophen (NORCO) 7.5-325 MG per tablet    Sig: Take 1 tablet by mouth every 6 (six) hours as needed.    Dispense:  120 tablet    Refill:  0    DO NOT FILL UNTIL 07-20-2014.  Marland Kitchen DISCONTD: HYDROcodone-acetaminophen (NORCO) 7.5-325 MG per tablet    Sig: Take 1 tablet by mouth every 6 (six) hours as needed.    Dispense:  120 tablet    Refill:  0    DO NOT FILL UNTIL 08-19-2014.  Marland Kitchen HYDROcodone-acetaminophen (NORCO) 7.5-325 MG per  tablet    Sig: Take 1 tablet by mouth every 6 (six) hours as needed.    Dispense:  120 tablet    Refill:  0    DO NOT FILL UNTIL 09-19-2014.  . cephALEXin (KEFLEX) 500 MG capsule    Sig: Take 1 capsule (500 mg total) by mouth 3 (three) times daily.    Dispense:  30 capsule    Refill:  0  . SUMAtriptan (IMITREX) 100 MG tablet    Sig: Take 1 tablet (100 mg total) by mouth once. May repeat in 2 hours if headache persists or recurs.    Dispense:  9 tablet    Refill:  5    Return in about 3 months (around 10/11/2014).     Kristi Paulita Fujita, M.D. [facility] [address] [place], [place]  [address] [phone] phone [phone] fax

## 2014-07-13 MED ORDER — METOPROLOL TARTRATE 50 MG PO TABS: ORAL_TABLET | ORAL | Status: DC

## 2014-07-13 MED ORDER — GABAPENTIN 300 MG PO CAPS: 300.0000 mg | ORAL_CAPSULE | Freq: Three times a day (TID) | ORAL | Status: DC

## 2014-07-13 MED ORDER — CYCLOBENZAPRINE HCL 5 MG PO TABS: 5.0000 mg | ORAL_TABLET | Freq: Three times a day (TID) | ORAL | Status: DC | PRN

## 2014-07-13 MED ORDER — CITALOPRAM HYDROBROMIDE 40 MG PO TABS: 40.0000 mg | ORAL_TABLET | Freq: Every day | ORAL | Status: DC

## 2014-07-13 MED ORDER — PROMETHAZINE HCL 25 MG PO TABS: 25.0000 mg | ORAL_TABLET | Freq: Four times a day (QID) | ORAL | Status: DC | PRN

## 2014-07-21 ENCOUNTER — Ambulatory Visit: Payer: Self-pay | Admitting: Family Medicine

## 2014-07-25 ENCOUNTER — Other Ambulatory Visit: Payer: Self-pay | Admitting: Family Medicine

## 2014-07-26 NOTE — Telephone Encounter (Signed)
Pt states she stil has not heard from us regarding her Tramadol refill   Best phone for pt is [phone]   [facility]

## 2014-07-27 NOTE — Telephone Encounter (Signed)
LMOM for pt that her tramadol RF was sent yesterday.

## 2014-10-13 ENCOUNTER — Ambulatory Visit (INDEPENDENT_AMBULATORY_CARE_PROVIDER_SITE_OTHER): Payer: Self-pay | Admitting: Family Medicine

## 2014-10-13 ENCOUNTER — Encounter: Payer: Self-pay | Admitting: Family Medicine

## 2014-10-13 VITALS — BP 134/89 | HR 87 | Temp 98.6°F | Resp 16 | Ht 65.0 in | Wt 273.4 lb

## 2014-10-13 DIAGNOSIS — F418 Other specified anxiety disorders: Secondary | ICD-10-CM

## 2014-10-13 DIAGNOSIS — G894 Chronic pain syndrome: Secondary | ICD-10-CM

## 2014-10-13 DIAGNOSIS — G43709 Chronic migraine without aura, not intractable, without status migrainosus: Secondary | ICD-10-CM

## 2014-10-13 DIAGNOSIS — F419 Anxiety disorder, unspecified: Secondary | ICD-10-CM

## 2014-10-13 DIAGNOSIS — F32A Depression, unspecified: Secondary | ICD-10-CM

## 2014-10-13 DIAGNOSIS — M47816 Spondylosis without myelopathy or radiculopathy, lumbar region: Secondary | ICD-10-CM

## 2014-10-13 DIAGNOSIS — M4803 Spinal stenosis, cervicothoracic region: Secondary | ICD-10-CM

## 2014-10-13 DIAGNOSIS — F329 Major depressive disorder, single episode, unspecified: Secondary | ICD-10-CM

## 2014-10-13 DIAGNOSIS — I10 Essential (primary) hypertension: Secondary | ICD-10-CM

## 2014-10-13 MED ORDER — TRAMADOL HCL 50 MG PO TABS: 50.0000 mg | ORAL_TABLET | Freq: Four times a day (QID) | ORAL | Status: DC

## 2014-10-13 MED ORDER — ALPRAZOLAM 0.5 MG PO TABS: 0.5000 mg | ORAL_TABLET | Freq: Every evening | ORAL | Status: AC | PRN

## 2014-10-13 MED ORDER — HYDROCODONE-ACETAMINOPHEN 7.5-325 MG PO TABS: 1.0000 | ORAL_TABLET | Freq: Four times a day (QID) | ORAL | Status: DC | PRN

## 2014-10-13 NOTE — Progress Notes (Signed)
Subjective:    Patient ID: Natalie [place], female    DOB: [birth year], 39 y.o.   MRN: [id number]  10/13/2014  Pain; Anxiety; and Hypertension   HPI This 39 y.o. female presents for evaluation three month follow-up:  1.  Anxiety and depression: oldest dog died this month; thinks had thyroid cancer; had large growth on thyroid.  Has 62 month old puppy; has another dog.  Emotionally stable; working on the house.  Got desk set up so that pt can work; working on [employer].  Just bought new sectional with 2 recliners.  Went for [employer] and Wine party for birthday.  No SI/HI.  Still taking Xanax "more than you want me to".  Taking Xanax none to twice daily.  Usually will take it at night.  80% of time, it is nighttime.  Boyfriend with anxiety; working on Brewing technologist.    2.  Chronic pain syndrome:  Three month follow-up.  Denied disability; now must get a lawyer to help with appeal.   DDD cervical neck: neck really hurt this morning.  Used TENS unit; now feeling better. Taking medications; loves TENS unit. Lower back: no pain today.  Not on feet as much.  Mid back pain chronic with standing.   Flexeril 5mg  qhs PRN; causes sedation the following day; makes groggy. Gabapentin tid Tramadol qid Hydrocodone qid.  [facility] off last week finally; prefers to wait on blood work until receives [facility].   3.  HTN: Patient reports good compliance with medication, good tolerance to medication, and good symptom control.  Only checks BP if feeling [place]; no high readings.  4. Migraines: used Imitrex once since last visit.  5.Irregular menses: still sporadic.    '  Review of Systems  Constitutional: Positive for fatigue. Negative for fever, chills and diaphoresis.  Eyes: Negative for visual disturbance.  Respiratory: Negative for cough and shortness of breath.   Cardiovascular: Negative for chest pain, palpitations and leg swelling.  Gastrointestinal: Negative for nausea, vomiting,  abdominal pain, diarrhea and constipation.  Endocrine: Negative for cold intolerance, heat intolerance, polydipsia, polyphagia and polyuria.  Genitourinary: Positive for menstrual problem.  Musculoskeletal: Positive for myalgias, back pain, arthralgias, gait problem, neck pain and neck stiffness.  Neurological: Negative for dizziness, tremors, seizures, syncope, facial asymmetry, speech difficulty, weakness, light-headedness, numbness and headaches.  Psychiatric/Behavioral: Positive for dysphoric mood. Negative for suicidal ideas, sleep disturbance and self-injury. The patient is nervous/anxious.     Past Medical History  Diagnosis Date  . Allergy   . Anxiety   . Depression   . Hypertension   . Spinal stenosis   . Migraine   . Chronic pain disorder   . Cervical spondylosis without myelopathy     diagnosed by [facility] 2013.  Marland Kitchen Spondylosis of lumbar region without myelopathy or radiculopathy     diagnosed by [facility] 2013.   Past Surgical History  Procedure Laterality Date  . Wisdom tooth extraction     Allergies  Allergen Reactions  . Penicillins   . Sulfa Antibiotics   . Zithromax [Azithromycin]    Current Outpatient Prescriptions  Medication Sig Dispense Refill  . ALPRAZolam (XANAX) 0.5 MG tablet Take 1 tablet (0.5 mg total) by mouth at bedtime as needed. 30 tablet 5  . B Complex-C (B-COMPLEX WITH VITAMIN C) tablet Take 1 tablet by mouth daily.    . citalopram (CELEXA) 40 MG tablet Take 1 tablet (40 mg total) by mouth daily. 30 tablet 11  .  cyclobenzaprine (FLEXERIL) 5 MG tablet Take 1 tablet (5 mg total) by mouth 3 (three) times daily as needed. for muscle spasm 30 tablet 5  . fexofenadine-pseudoephedrine (ALLEGRA-D 24) 180-240 MG per 24 hr tablet Take 1 tablet by mouth daily.    . fluticasone (FLONASE) 50 MCG/ACT nasal spray Place 2 sprays into the nose daily. 16 g 6  . gabapentin (NEURONTIN) 300 MG capsule Take 1 capsule (300 mg total) by mouth 3  (three) times daily. 90 capsule 5  . HYDROcodone-acetaminophen (NORCO) 7.5-325 MG per tablet Take 1 tablet by mouth every 6 (six) hours as needed. 120 tablet 0  . metoprolol (LOPRESSOR) 50 MG tablet 1 tablet twice daily 60 tablet 11  . Multiple Vitamin (MULTIVITAMIN) tablet Take 1 tablet by mouth daily.    Marland Kitchen omeprazole (PRILOSEC) 20 MG capsule Take 1 capsule (20 mg total) by mouth daily. 90 capsule 1  . promethazine (PHENERGAN) 25 MG tablet Take 1 tablet (25 mg total) by mouth every 6 (six) hours as needed for nausea or vomiting. 30 tablet 1  . SUMAtriptan (IMITREX) 100 MG tablet Take 1 tablet (100 mg total) by mouth once. May repeat in 2 hours if headache persists or recurs. 9 tablet 5  . traMADol (ULTRAM) 50 MG tablet Take 1 tablet (50 mg total) by mouth 4 (four) times daily. 120 tablet 2   No current facility-administered medications for this visit.   Social History   Social History  . Marital Status: Single    Spouse Name: N/A  . Number of Children: 0  . Years of Education: N/A   Occupational History  . unemployed    Social History Main Topics  . Smoking status: Current Every Day Smoker -- 0.50 packs/day    Types: Cigarettes  . Smokeless tobacco: Not on file  . Alcohol Use: Yes  . Drug Use: No  . Sexual Activity: Yes   Other Topics Concern  . Not on file   Social History Narrative   Marital status: single; dating casually      Children: none      Lives: with mother      Employment: unemployed       Exercise: none   Family History  Problem Relation Age of Onset  . Depression Mother   . Hyperlipidemia Mother   . Anxiety disorder Mother   . Diabetes Father   . Hypertension Father   . Cancer Maternal Grandmother   . Cancer Maternal Grandfather   . Heart attack Maternal Grandfather   . Cancer Paternal Grandmother   . Alzheimer's disease Paternal Grandmother   . Cancer Paternal Grandfather        Objective:    BP 134/89 mmHg  Pulse 87  Temp(Src) 98.6 F (37  C) (Oral)  Resp 16  Ht  (1.651 m)  Wt 273 lb 6.4 oz (124.013 kg)  BMI 45.50 kg/m2  SpO2 95% Physical Exam  Constitutional: She is oriented to person, place, and time. She appears well-developed and well-nourished. No distress.  Morbidly obese  HENT:  Head: Normocephalic and atraumatic.  Right Ear: External ear normal.  Left Ear: External ear normal.  Nose: Nose normal.  Mouth/Throat: Oropharynx is clear and moist.  Eyes: Conjunctivae and EOM are normal. Pupils are equal, round, and reactive to light.  Neck: Normal range of motion. Neck supple. Carotid bruit is not present. No thyromegaly present.  Cardiovascular: Normal rate, regular rhythm, normal heart sounds and intact distal pulses.  Exam reveals no gallop  and no friction rub.   No murmur heard. Pulmonary/Chest: Effort normal and breath sounds normal. She has no wheezes. She has no rales.  Abdominal: Soft. Bowel sounds are normal. She exhibits no distension and no mass. There is no tenderness. There is no rebound and no guarding.  Musculoskeletal:       Right shoulder: Normal.       Cervical back: She exhibits pain. She exhibits no tenderness, no bony tenderness, no spasm and normal pulse.       Lumbar back: She exhibits pain. She exhibits normal range of motion, no tenderness, no bony tenderness and normal pulse.  Lymphadenopathy:    She has no cervical adenopathy.  Neurological: She is alert and oriented to person, place, and time. No cranial nerve deficit.  Skin: Skin is warm and dry. No rash noted. She is not diaphoretic. No erythema. No pallor.  Psychiatric: She has a normal mood and affect. Her behavior is normal.   Results for orders placed or performed in visit on 04/14/14  POCT urinalysis dipstick  Result Value Ref Range   Color, UA yellow    Clarity, UA clear    Glucose, UA neg    Bilirubin, UA neg    Ketones, UA neg    Spec Grav, UA 1.015    Blood, UA neg    pH, UA 7.5    Protein, UA neg     Urobilinogen, UA 0.2    Nitrite, UA neg    Leukocytes, UA Negative   POCT urine pregnancy  Result Value Ref Range   Preg Test, Ur Negative        Assessment & Plan:   1. Spinal stenosis of cervicothoracic region   2. Chronic pain syndrome   3. Essential hypertension   4. Anxiety and depression   5. Chronic migraine without aura without status migrainosus, not intractable   6. Morbid obesity   7. Spondylosis of lumbar region without myelopathy or radiculopathy     1. Chronic pain syndrome: stable; secondary to spinal stenosis of cervicothoracic spine and spondylosis of lumbar spine; [facility] yet continues with PCP for refills due to lack of insurance.  Refills of Hydrocodone x 3 provided; continue Tramadol.  [facility]waiting [facility] to return to pain management; will warrant drug screening at next visit due to current CDC guidelines for chronic narcotic management. 2.  HTN: controlled; continue current medications. 3.  Anxiety and depression: improved with Celexa therapy at 40mg  daily; discussed risk of benzos with chronic daily narcotic use; will plan to wean Xanax over the next six months.  Discourage daily use.   4. Migraines: stable; using Imitrex sparingly. 5. Morbid obesity: with slow weight loss; warrants sleep study to evaluate for OSA.    Meds ordered this encounter  Medications  . ALPRAZolam (XANAX) 0.5 MG tablet    Sig: Take 1 tablet (0.5 mg total) by mouth at bedtime as needed.    Dispense:  30 tablet    Refill:  5    This request is for a new prescription for a controlled substance as required by Federal/State law.  . DISCONTD: HYDROcodone-acetaminophen (NORCO) 7.5-325 MG per tablet    Sig: Take 1 tablet by mouth every 6 (six) hours as needed.    Dispense:  120 tablet    Refill:  0    DO NOT FILL UNTIL 10-19-2014.  . traMADol (ULTRAM) 50 MG tablet    Sig: Take 1 tablet (  50 mg total) by mouth 4 (four) times daily.     Dispense:  120 tablet    Refill:  2    Not to exceed 5 additional fills before 10/17/2014.  Marland Kitchen DISCONTD: HYDROcodone-acetaminophen (NORCO) 7.5-325 MG per tablet    Sig: Take 1 tablet by mouth every 6 (six) hours as needed.    Dispense:  120 tablet    Refill:  0    DO NOT FILL UNTIL 11-19-2014.  Marland Kitchen HYDROcodone-acetaminophen (NORCO) 7.5-325 MG per tablet    Sig: Take 1 tablet by mouth every 6 (six) hours as needed.    Dispense:  120 tablet    Refill:  0    DO NOT FILL UNTIL 12-20-2014.    No Follow-up on file.    Anitra Doxtater Paulita Fujita, M.D. [facility] [address] [place], [place]  [address] [phone] phone [phone] fax

## 2014-12-25 ENCOUNTER — Telehealth: Payer: Self-pay | Admitting: Family Medicine

## 2014-12-25 NOTE — Telephone Encounter (Signed)
lmom for patient to call and reschedule her appointment with Dr Smith for and OV 

## 2015-01-10 ENCOUNTER — Telehealth: Payer: Self-pay | Admitting: Family Medicine

## 2015-01-10 DIAGNOSIS — M4803 Spinal stenosis, cervicothoracic region: Secondary | ICD-10-CM

## 2015-01-10 DIAGNOSIS — G894 Chronic pain syndrome: Secondary | ICD-10-CM

## 2015-01-10 NOTE — Telephone Encounter (Signed)
Pt called to request a refill on Tramadol as well.  [phone]

## 2015-01-10 NOTE — Telephone Encounter (Signed)
Pt has appointment scheduled for 02/18/15 @ 1pm for Rx refill; is on waiting list, but needs refill by 01/19/15.  Vikadin HYDROcodone-acetaminophen (NORCO) 7.5-325 MG per tablet;  Please contact to advise at  [phone]    [phone]

## 2015-01-12 NOTE — Telephone Encounter (Signed)
Pt called to check status of rx refill.   [phone]

## 2015-01-14 MED ORDER — HYDROCODONE-ACETAMINOPHEN 7.5-325 MG PO TABS: 1.0000 | ORAL_TABLET | Freq: Four times a day (QID) | ORAL | Status: DC | PRN

## 2015-01-14 MED ORDER — TRAMADOL HCL 50 MG PO TABS: 50.0000 mg | ORAL_TABLET | Freq: Four times a day (QID) | ORAL | Status: DC

## 2015-01-14 NOTE — Telephone Encounter (Signed)
Pt notified that rx is ready for pickup and rx for tramadol faxed to pharmacy

## 2015-01-14 NOTE — Telephone Encounter (Signed)
Please call --- hydrocodone rx ready for pick up.

## 2015-01-17 ENCOUNTER — Ambulatory Visit: Payer: Self-pay | Admitting: Family Medicine

## 2015-02-18 ENCOUNTER — Ambulatory Visit (INDEPENDENT_AMBULATORY_CARE_PROVIDER_SITE_OTHER): Payer: Self-pay | Admitting: Family Medicine

## 2015-02-18 ENCOUNTER — Encounter: Payer: Self-pay | Admitting: Family Medicine

## 2015-02-18 VITALS — BP 132/88 | HR 75 | Temp 97.7°F | Resp 16 | Wt 284.0 lb

## 2015-02-18 DIAGNOSIS — M47816 Spondylosis without myelopathy or radiculopathy, lumbar region: Secondary | ICD-10-CM

## 2015-02-18 DIAGNOSIS — I10 Essential (primary) hypertension: Secondary | ICD-10-CM

## 2015-02-18 DIAGNOSIS — F418 Other specified anxiety disorders: Secondary | ICD-10-CM

## 2015-02-18 DIAGNOSIS — G894 Chronic pain syndrome: Secondary | ICD-10-CM

## 2015-02-18 DIAGNOSIS — N926 Irregular menstruation, unspecified: Secondary | ICD-10-CM

## 2015-02-18 DIAGNOSIS — M4803 Spinal stenosis, cervicothoracic region: Secondary | ICD-10-CM

## 2015-02-18 DIAGNOSIS — F329 Major depressive disorder, single episode, unspecified: Secondary | ICD-10-CM

## 2015-02-18 DIAGNOSIS — M47812 Spondylosis without myelopathy or radiculopathy, cervical region: Secondary | ICD-10-CM

## 2015-02-18 DIAGNOSIS — F419 Anxiety disorder, unspecified: Secondary | ICD-10-CM

## 2015-02-18 MED ORDER — HYDROCODONE-ACETAMINOPHEN 7.5-325 MG PO TABS: 1.0000 | ORAL_TABLET | Freq: Four times a day (QID) | ORAL | Status: DC | PRN

## 2015-02-18 MED ORDER — MEDROXYPROGESTERONE ACETATE 10 MG PO TABS: 10.0000 mg | ORAL_TABLET | Freq: Every day | ORAL | Status: DC

## 2015-02-18 NOTE — Progress Notes (Signed)
Subjective:    Patient ID: Natalie Dawson, female    DOB: 1975-07-14, 39 y.o.   MRN: [id number]  02/18/2015  Medication Refill   HPI This 39 y.o. female presents for four month follow-up:   1. Chronic pain syndrome/spinal stenosis/lower back pain: napping on the couch; dogs sleeping with dogs.  L side pain; +radiation into legs.  If sleeps on side, has R sided leg pain with numbness and radiates into R leg.  Can feel it starts in back lower. Cannot sleep flat on back.  Uses large pillow to provide head elevation and then back does not hurt as badly.  If rolls into stomach, has more pain.   Sent in application for [facility] 4 months ago; has not called for status. Phone is cut off currently.  Can call from [employer] and messenger; can make a phone call through Facebook on someone's WiFii.  Cannot afford $100 verizon monthly.  Mom going to pay off bill and then cut it off.  Phone is not under contract anymore.  Can get straight talk for $40/month for unlimited.  Needs to check on disability status; disability has been denied in July 2017; needs to appeal it but has not started this process yet due to lack of motivation.    2.  HTN: Patient reports good compliance with medication, good tolerance to medication, and good symptom control.    3. Depression and anxiety: Patient reports good compliance with medication, good tolerance to medication, and good symptom control.   Things not good with dad and stepmother.  Did not go to Dad's yesterday for Thanksgiving.  Does not know father's family well.  Not very close with father's side of family.  Things are good with Thereasa Distance; he is actually working some; not working 40 hours per week. More and more side work. Paying bills.   Things with mom are good.   Had been doing [employer].   No motivation lately; more tired.   Napping during the day lately. Two days per week.  Two hour naps during the day.   Thereasa Distance and patient got engaged; wants to  get married in June 2017.  Has been together 27 years; started dating in 55.  Plans going to magistrates office.   Zoloft worked well but stopped working.  Also kept pt in a fog. Celexa does not cause a fog. Prozac years ago; Thereasa Distance stated that patient was crazy.  Did not care about anything while taking it. Paxil worked well but stopped working.  No previous Wellbutrin. No previous Cymbalta.  4. Migraines: has suffered with several lately; thinks due to prolonged menses.  Took pregnancy test right before starting on 01/27/15.   Review of Systems  Constitutional: Negative for fever, chills, diaphoresis and fatigue.  Eyes: Negative for visual disturbance.  Respiratory: Negative for cough and shortness of breath.   Cardiovascular: Negative for chest pain, palpitations and leg swelling.  Gastrointestinal: Negative for nausea, vomiting, abdominal pain, diarrhea and constipation.  Endocrine: Negative for cold intolerance, heat intolerance, polydipsia, polyphagia and polyuria.  Genitourinary: Positive for menstrual problem.  Musculoskeletal: Positive for myalgias, back pain and arthralgias.  Neurological: Positive for headaches. Negative for dizziness, tremors, seizures, syncope, facial asymmetry, speech difficulty, weakness, light-headedness and numbness.  Psychiatric/Behavioral: Positive for dysphoric mood. Negative for suicidal ideas, sleep disturbance and self-injury. The patient is nervous/anxious.     Past Medical History  Diagnosis Date  . Allergy   . Anxiety   . Depression   .  Hypertension   . Spinal stenosis   . Migraine   . Chronic pain disorder   . Cervical spondylosis without myelopathy     diagnosed by [facility] 2013.  Marland Kitchen Spondylosis of lumbar region without myelopathy or radiculopathy     diagnosed by [facility] 2013.   Past Surgical History  Procedure Laterality Date  . Wisdom tooth extraction     Allergies  Allergen Reactions  . Penicillins     . Sulfa Antibiotics   . Zithromax [Azithromycin]    Current Outpatient Prescriptions  Medication Sig Dispense Refill  . ALPRAZolam (XANAX) 0.5 MG tablet Take 1 tablet (0.5 mg total) by mouth at bedtime as needed. 30 tablet 5  . B Complex-C (B-COMPLEX WITH VITAMIN C) tablet Take 1 tablet by mouth daily.    . citalopram (CELEXA) 40 MG tablet Take 1 tablet (40 mg total) by mouth daily. 30 tablet 11  . cyclobenzaprine (FLEXERIL) 5 MG tablet Take 1 tablet (5 mg total) by mouth 3 (three) times daily as needed. for muscle spasm 30 tablet 5  . fexofenadine-pseudoephedrine (ALLEGRA-D 24) 180-240 MG per 24 hr tablet Take 1 tablet by mouth daily.    . fluticasone (FLONASE) 50 MCG/ACT nasal spray Place 2 sprays into the nose daily. 16 g 6  . gabapentin (NEURONTIN) 300 MG capsule Take 1 capsule (300 mg total) by mouth 3 (three) times daily. 90 capsule 5  . HYDROcodone-acetaminophen (NORCO) 7.5-325 MG tablet Take 1 tablet by mouth every 6 (six) hours as needed. 120 tablet 0  . metoprolol (LOPRESSOR) 50 MG tablet 1 tablet twice daily 60 tablet 11  . Multiple Vitamin (MULTIVITAMIN) tablet Take 1 tablet by mouth daily.    Marland Kitchen omeprazole (PRILOSEC) 20 MG capsule Take 1 capsule (20 mg total) by mouth daily. 90 capsule 1  . promethazine (PHENERGAN) 25 MG tablet Take 1 tablet (25 mg total) by mouth every 6 (six) hours as needed for nausea or vomiting. 30 tablet 1  . SUMAtriptan (IMITREX) 100 MG tablet Take 1 tablet (100 mg total) by mouth once. May repeat in 2 hours if headache persists or recurs. 9 tablet 5  . traMADol (ULTRAM) 50 MG tablet Take 1 tablet (50 mg total) by mouth 4 (four) times daily. 120 tablet 2  . medroxyPROGESTERone (PROVERA) 10 MG tablet Take 1 tablet (10 mg total) by mouth daily. Take every three months to induce a period. 10 tablet 4   No current facility-administered medications for this visit.   Social History   Social History  . Marital Status: Single    Spouse Name: N/A  . Number of  Children: 0  . Years of Education: N/A   Occupational History  . unemployed    Social History Main Topics  . Smoking status: Current Every Day Smoker -- 0.50 packs/day    Types: Cigarettes  . Smokeless tobacco: Not on file  . Alcohol Use: Yes  . Drug Use: No  . Sexual Activity: Yes   Other Topics Concern  . Not on file   Social History Narrative   Marital status: single; dating casually      Children: none      Lives: with mother      Employment: unemployed       Exercise: none   Family History  Problem Relation Age of Onset  . Depression Mother   . Hyperlipidemia Mother   . Anxiety disorder Mother   . Diabetes Father   . Hypertension Father   .  Cancer Maternal Grandmother   . Cancer Maternal Grandfather   . Heart attack Maternal Grandfather   . Cancer Paternal Grandmother   . Alzheimer's disease Paternal Grandmother   . Cancer Paternal Grandfather        Objective:    BP 132/88 mmHg  Pulse 75  Temp(Src) 97.7 F (36.5 C) (Oral)  Resp 16  Wt 284 lb (128.822 kg)  LMP 01/27/2015 Physical Exam  Constitutional: She is oriented to person, place, and time. She appears well-developed and well-nourished. No distress.  Morbidly obese  HENT:  Head: Normocephalic and atraumatic.  Right Ear: External ear normal.  Left Ear: External ear normal.  Nose: Nose normal.  Mouth/Throat: Oropharynx is clear and moist.  Eyes: Conjunctivae and EOM are normal. Pupils are equal, round, and reactive to light.  Neck: Normal range of motion. Neck supple. Carotid bruit is not present. No thyromegaly present.  Cardiovascular: Normal rate, regular rhythm, normal heart sounds and intact distal pulses.  Exam reveals no gallop and no friction rub.   No murmur heard. Pulmonary/Chest: Effort normal and breath sounds normal. She has no wheezes. She has no rales.  Abdominal: Soft. Bowel sounds are normal. She exhibits no distension and no mass. There is no tenderness. There is no rebound and  no guarding.  Lymphadenopathy:    She has no cervical adenopathy.  Neurological: She is alert and oriented to person, place, and time. No cranial nerve deficit.  Skin: Skin is warm and dry. No rash noted. She is not diaphoretic. No erythema. No pallor.  Psychiatric: She has a normal mood and affect. Her behavior is normal.   Results for orders placed or performed in visit on 04/14/14  POCT urinalysis dipstick  Result Value Ref Range   Color, UA yellow    Clarity, UA clear    Glucose, UA neg    Bilirubin, UA neg    Ketones, UA neg    Spec Grav, UA 1.015    Blood, UA neg    pH, UA 7.5    Protein, UA neg    Urobilinogen, UA 0.2    Nitrite, UA neg    Leukocytes, UA Negative   POCT urine pregnancy  Result Value Ref Range   Preg Test, Ur Negative        Assessment & Plan:   1. Irregular menses   2. Spinal stenosis of cervicothoracic region   3. Chronic pain syndrome   4. Essential hypertension, benign   5. Anxiety and depression   6. Spondylosis of lumbar region without myelopathy or radiculopathy   7. Cervical spondylosis without myelopathy   8. Morbid obesity due to excess calories (HCC)     1. Irregular menses: persistent; recent urine pregnancy negative so pt declined repeat urine pregnancy in office; rx for Provera 10mg  daily x 10 days every three months to induce menses. 2.  Spinal stenosis cervicothoracic region: persistent.  Continue current medications. 3.  Lumbar spondylosis: persistent; continue current medications. 4. Chronic pain syndrome: persistent; due to cervical spine and lumbar spine pathology; s/p pain management evaluation in 2014; [facility]  Will obtain urine drug screen at next visit.  Also will wean benzo per current CDC guidelines for all patients maintained on chronic narcotics.  Pt expressed understanding of these guidelines.   Disability denied and has not attempted to appeal. 5.  Anxiety and depression: worsening due to strain  with father; no adjustments in medications at this time.  Consider adding Wellbutrin  or Cymbalta in the future; will decrease Xanax to 0.25mg  at next visit and wean to OFF. 6.  HTN: controlled; obtain labs next visit.  Continue current medications. 7.  Morbid obesity: worsening and impacting all aspects of care; obtain labs next visit; likely suffers with OSA; cannot afford sleep study due to self pay status.   No orders of the defined types were placed in this encounter.   Meds ordered this encounter  Medications  . DISCONTD: HYDROcodone-acetaminophen (NORCO) 7.5-325 MG tablet    Sig: Take 1 tablet by mouth every 6 (six) hours as needed.    Dispense:  120 tablet    Refill:  0    DO NOT FILL UNTIL 02-19-2015.  Marland Kitchen DISCONTD: HYDROcodone-acetaminophen (NORCO) 7.5-325 MG tablet    Sig: Take 1 tablet by mouth every 6 (six) hours as needed.    Dispense:  120 tablet    Refill:  0    DO NOT FILL UNTIL 03-21-2015.  Marland Kitchen HYDROcodone-acetaminophen (NORCO) 7.5-325 MG tablet    Sig: Take 1 tablet by mouth every 6 (six) hours as needed.    Dispense:  120 tablet    Refill:  0    DO NOT FILL UNTIL 04-21-2015.  . medroxyPROGESTERone (PROVERA) 10 MG tablet    Sig: Take 1 tablet (10 mg total) by mouth daily. Take every three months to induce a period.    Dispense:  10 tablet    Refill:  4    Return in about 3 months (around 05/21/2015).    Kristi Paulita Fujita, M.D. [facility] [address] [place], [place]  [address] [phone] phone [phone] fax

## 2015-04-27 ENCOUNTER — Other Ambulatory Visit: Payer: Self-pay | Admitting: Family Medicine

## 2015-04-28 NOTE — Telephone Encounter (Signed)
Please call in refill of Tramadol as approved. 

## 2015-04-29 NOTE — Telephone Encounter (Signed)
Called in.

## 2015-05-18 ENCOUNTER — Other Ambulatory Visit: Payer: Self-pay

## 2015-05-18 DIAGNOSIS — G894 Chronic pain syndrome: Secondary | ICD-10-CM

## 2015-05-18 DIAGNOSIS — M4803 Spinal stenosis, cervicothoracic region: Secondary | ICD-10-CM

## 2015-05-18 NOTE — Telephone Encounter (Signed)
Pt called and cancelled her appt with Dr Katrinka Blazing but need to have her HYDROCODONE 7.5-325 MG refilled, please call [phone] when ready for pick up

## 2015-05-20 ENCOUNTER — Telehealth: Payer: Self-pay

## 2015-05-20 ENCOUNTER — Ambulatory Visit: Payer: Self-pay | Admitting: Family Medicine

## 2015-05-20 NOTE — Telephone Encounter (Signed)
The patient called to ask for a refill of HYDROcodone-acetaminophen (NORCO) 7.5-325 MG tablet. I let her know that an order was pending Dr Michaelle Copas signature.  I informed her that Dr Katrinka Blazing would not be back in the office until Monday, but she said she would call back tomorrow to check on it again.  CB#: [phone]

## 2015-05-20 NOTE — Telephone Encounter (Signed)
Pended for 30 days after last RF.

## 2015-05-23 NOTE — Telephone Encounter (Signed)
Pt called again regarding her HYDROCODONE 7.5-325 mg. States she really need it asap. Please call pt at [phone]  Stated she will be coming to the walk in to see her in the middle of March

## 2015-05-24 ENCOUNTER — Ambulatory Visit: Payer: Self-pay | Admitting: Family Medicine

## 2015-05-24 MED ORDER — HYDROCODONE-ACETAMINOPHEN 7.5-325 MG PO TABS: 1.0000 | ORAL_TABLET | Freq: Four times a day (QID) | ORAL | Status: DC | PRN

## 2015-05-24 NOTE — Telephone Encounter (Signed)
Notes copied from duplicate messages:///  ------------  HYDROcodone-acetaminophen (NORCO) 7.5-325 MG tablet [[id number]]      Order Details    Dose: 1 tablet Route: Oral Frequency: Every 6 hours PRN   Order Comments:   DO NOT FILL UNTIL 04-21-2015.              Lilia Pro  Signed  Service date: 05/24/2015 1:39 PM    Expand All Collapse All Pt called to check on the status/// Pt states she has been out since 05/21/15   [phone]       Red Christians  Signed  Service date: 05/23/2015 11:06 AM    Expand All Collapse All Pt called again regarding her HYDROCODONE 7.5-325 mg. States she really need it asap. Please call pt at [phone]   Stated she will be coming to the walk in to see her in the middle of March          Melanie M Joyner  Signed  Service date: 05/20/2015 5:19 PM    Expand All Collapse All The patient called to ask for a refill of HYDROcodone-acetaminophen (NORCO) 7.5-325 MG tablet. I let her know that an order was pending Dr Michaelle Copas signature.  I informed her that Dr Katrinka Blazing would not be back in the office until Monday, but she said she would call back tomorrow to check on it again.   CB#: [phone]

## 2015-05-24 NOTE — Telephone Encounter (Signed)
I called and spoke to [facility] clinical staff and asked them to ask Dr Katrinka Blazing to review this request when she has a chance.

## 2015-05-24 NOTE — Telephone Encounter (Signed)
Pt called to check on the status/// Pt states she has been out since 05/21/15  [phone]

## 2015-05-24 NOTE — Telephone Encounter (Signed)
Call --   Hydrocodone rx ready for pick up.

## 2015-05-24 NOTE — Telephone Encounter (Signed)
Please call --- hydrocodone rx ready for pick up. 

## 2015-05-24 NOTE — Telephone Encounter (Signed)
Please disregard as other messages have been entered.... Copying all documentation to previous message.Marland KitchenMarland KitchenMarland Kitchen

## 2015-05-25 ENCOUNTER — Telehealth: Payer: Self-pay | Admitting: *Deleted

## 2015-05-25 NOTE — Telephone Encounter (Signed)
Pt has been advised.

## 2015-05-25 NOTE — Telephone Encounter (Signed)
Called to let patient know prescription [facility]) is read for pick up at [facility] appointment building before 5 pm today.

## 2015-06-19 ENCOUNTER — Telehealth: Payer: Self-pay

## 2015-06-19 DIAGNOSIS — G894 Chronic pain syndrome: Secondary | ICD-10-CM

## 2015-06-19 DIAGNOSIS — M4803 Spinal stenosis, cervicothoracic region: Secondary | ICD-10-CM

## 2015-06-19 NOTE — Telephone Encounter (Signed)
Patient requesting a refill on her Hydrocodone. Patient will run out on 06/21/15 and request to be called when ready at [phone].

## 2015-06-19 NOTE — Telephone Encounter (Signed)
Patient wanted me to let Dr Katrinka BlazingSmith know she has not been in due to financial reasons. She stated she would be coming in to see her at the walkin on April 7th no later than April 9th. She tried to make an appointment but dr Katrinka Blazingsmith is booked until august.

## 2015-06-21 MED ORDER — HYDROCODONE-ACETAMINOPHEN 7.5-325 MG PO TABS: 1.0000 | ORAL_TABLET | Freq: Four times a day (QID) | ORAL | Status: DC | PRN

## 2015-06-21 NOTE — Telephone Encounter (Signed)
Call ---rx is ready for pick up at [facility].

## 2015-06-22 NOTE — Telephone Encounter (Signed)
Pt.notified

## 2015-07-21 ENCOUNTER — Other Ambulatory Visit: Payer: Self-pay | Admitting: Family Medicine

## 2015-08-02 ENCOUNTER — Other Ambulatory Visit: Payer: Self-pay | Admitting: Family Medicine

## 2015-08-07 NOTE — Telephone Encounter (Signed)
Refill of Tramadol denied.  Patient has not been seen in six months; will not provide with further refills without appointment.  Please advise patient of my hours for the upcoming two weeks at the walk in clinic.

## 2015-09-16 ENCOUNTER — Other Ambulatory Visit: Payer: Self-pay | Admitting: Family Medicine

## 2015-09-30 ENCOUNTER — Telehealth: Payer: Self-pay | Admitting: Family Medicine

## 2015-09-30 NOTE — Telephone Encounter (Signed)
Patient request for a refill of Lopressor 50 MG. [facility] [place] [address] [phone]

## 2015-10-04 ENCOUNTER — Ambulatory Visit (INDEPENDENT_AMBULATORY_CARE_PROVIDER_SITE_OTHER): Payer: Self-pay | Admitting: Family Medicine

## 2015-10-04 ENCOUNTER — Encounter: Payer: Self-pay | Admitting: Family Medicine

## 2015-10-04 VITALS — BP 138/90 | HR 122 | Temp 97.6°F | Resp 20 | Ht 65.0 in | Wt 295.0 lb

## 2015-10-04 DIAGNOSIS — F32A Depression, unspecified: Secondary | ICD-10-CM

## 2015-10-04 DIAGNOSIS — F419 Anxiety disorder, unspecified: Secondary | ICD-10-CM

## 2015-10-04 DIAGNOSIS — M4803 Spinal stenosis, cervicothoracic region: Secondary | ICD-10-CM

## 2015-10-04 DIAGNOSIS — I10 Essential (primary) hypertension: Secondary | ICD-10-CM

## 2015-10-04 DIAGNOSIS — G894 Chronic pain syndrome: Secondary | ICD-10-CM

## 2015-10-04 DIAGNOSIS — F329 Major depressive disorder, single episode, unspecified: Secondary | ICD-10-CM

## 2015-10-04 DIAGNOSIS — M47816 Spondylosis without myelopathy or radiculopathy, lumbar region: Secondary | ICD-10-CM

## 2015-10-04 DIAGNOSIS — R000 Tachycardia, unspecified: Secondary | ICD-10-CM

## 2015-10-04 DIAGNOSIS — M47812 Spondylosis without myelopathy or radiculopathy, cervical region: Secondary | ICD-10-CM

## 2015-10-04 DIAGNOSIS — F418 Other specified anxiety disorders: Secondary | ICD-10-CM

## 2015-10-04 MED ORDER — HYDROCODONE-ACETAMINOPHEN 7.5-325 MG PO TABS: 1.0000 | ORAL_TABLET | Freq: Three times a day (TID) | ORAL | Status: AC | PRN

## 2015-10-04 MED ORDER — MEDROXYPROGESTERONE ACETATE 10 MG PO TABS
10.0000 mg | ORAL_TABLET | Freq: Every day | ORAL | Status: AC
Start: 2015-10-04 — End: ?

## 2015-10-04 MED ORDER — METOPROLOL TARTRATE 50 MG PO TABS: 50.0000 mg | ORAL_TABLET | Freq: Two times a day (BID) | ORAL | Status: AC

## 2015-10-04 MED ORDER — HYDROCODONE-ACETAMINOPHEN 7.5-325 MG PO TABS
1.0000 | ORAL_TABLET | Freq: Three times a day (TID) | ORAL | Status: DC | PRN
Start: 2015-10-04 — End: 2015-10-04

## 2015-10-04 MED ORDER — CITALOPRAM HYDROBROMIDE 20 MG PO TABS: 40.0000 mg | ORAL_TABLET | Freq: Every day | ORAL | Status: AC

## 2015-10-04 MED ORDER — HYDROCODONE-ACETAMINOPHEN 7.5-325 MG PO TABS: 1.0000 | ORAL_TABLET | Freq: Three times a day (TID) | ORAL | Status: DC | PRN

## 2015-10-04 MED ORDER — CYCLOBENZAPRINE HCL 5 MG PO TABS: 5.0000 mg | ORAL_TABLET | Freq: Three times a day (TID) | ORAL | Status: AC | PRN

## 2015-10-04 MED ORDER — OMEPRAZOLE 20 MG PO CPDR: 20.0000 mg | DELAYED_RELEASE_CAPSULE | Freq: Every day | ORAL | Status: AC

## 2015-10-04 MED ORDER — FLUTICASONE PROPIONATE 50 MCG/ACT NA SUSP: 2.0000 | Freq: Every day | NASAL | Status: AC

## 2015-10-04 MED ORDER — GABAPENTIN 300 MG PO CAPS: 300.0000 mg | ORAL_CAPSULE | Freq: Three times a day (TID) | ORAL | Status: AC

## 2015-10-04 MED ORDER — SUMATRIPTAN SUCCINATE 100 MG PO TABS: 100.0000 mg | ORAL_TABLET | Freq: Once | ORAL | Status: AC

## 2015-10-04 NOTE — Telephone Encounter (Signed)
Pt came in for OV on 7/11 and got this Rx.

## 2015-10-04 NOTE — Progress Notes (Signed)
Subjective:    Patient ID: Natalie Dawson, female    DOB: 1975-06-21, 40 y.o.   MRN: [id number]  10/04/2015  Medication Refill and depression screen 13   HPI This 40 y.o. female presents for follow-up of multiple chronic medical conditions. Last visit 01/2015.    Chronic pain syndrome/spinal stenosis of cervicothoracic spine:  Has not taken medication since 06/2015.    controlled substance registry: Tramadol 50mg   06/29/15 Hydrocodone 7.5/325  06/22/15 Alprazolam 04/08/15  Anxiety and depression: Got married in June 2017; wants to honeymoon along [employer]; wants to go to [employer]; absolutely fabulous.  Not commercial; can drive on beach.  [place]; can [place] fire on the beach.  Not as commercial as [address] or [place].  Go to [place] or [place].  Fishing.  Good area for driving on beach.  Pt does not have a car/transportation.  Husband has three trucks but can only drive automatic.  Hearing impairment interferes with ability to drive stick shift.  Husband plans to repair the automatic truck.   Taking Alprazolam sparingly.  HTN: ran out of Metoprolol.  Needs refill. Plans to establish with physician closer; pt does not have transportation to [place].  GERD: has not been taking medications.  Review of Systems  Constitutional: Negative for fever, chills, diaphoresis and fatigue.  Eyes: Negative for visual disturbance.  Respiratory: Negative for cough and shortness of breath.   Cardiovascular: Negative for chest pain, palpitations and leg swelling.  Gastrointestinal: Negative for nausea, vomiting, abdominal pain, diarrhea and constipation.  Endocrine: Negative for cold intolerance, heat intolerance, polydipsia, polyphagia and polyuria.  Musculoskeletal: Positive for arthralgias, myalgias, neck pain and neck stiffness.  Neurological: Negative for dizziness, tremors, seizures, syncope, facial asymmetry, speech difficulty, weakness,  light-headedness, numbness and headaches.  Psychiatric/Behavioral: Positive for dysphoric mood. Negative for self-injury, sleep disturbance and suicidal ideas. The patient is nervous/anxious.     Past Medical History:  Diagnosis Date  . Allergy   . Anxiety   . Cervical spondylosis without myelopathy    diagnosed by [facility] 2013.  Marland Kitchen Chronic pain disorder   . Depression   . Hypertension   . Migraine   . Spinal stenosis   . Spondylosis of lumbar region without myelopathy or radiculopathy    diagnosed by [facility] 2013.   Past Surgical History:  Procedure Laterality Date  . WISDOM TOOTH EXTRACTION     Allergies  Allergen Reactions  . Penicillins   . Sulfa Antibiotics   . Zithromax [Azithromycin]    Current Outpatient Prescriptions  Medication Sig Dispense Refill  . citalopram (CELEXA) 20 MG tablet Take 2 tablets (40 mg total) by mouth daily. 60 tablet 5  . cyclobenzaprine (FLEXERIL) 5 MG tablet Take 1 tablet (5 mg total) by mouth 3 (three) times daily as needed. for muscle spasm 90 tablet 5  . fluticasone (FLONASE) 50 MCG/ACT nasal spray Place 2 sprays into both nostrils daily. 16 g 6  . gabapentin (NEURONTIN) 300 MG capsule Take 1 capsule (300 mg total) by mouth 3 (three) times daily. 90 capsule 5  . HYDROcodone-acetaminophen (NORCO) 7.5-325 MG tablet Take 1 tablet by mouth 3 (three) times daily as needed. 90 tablet 0  . medroxyPROGESTERone (PROVERA) 10 MG tablet Take 1 tablet (10 mg total) by mouth daily. Take every three months to induce a period. 10 tablet 4  . metoprolol (LOPRESSOR) 50 MG tablet Take 1 tablet (50 mg total) by mouth 2 (two)  times daily. 60 tablet 5  . Multiple Vitamin (MULTIVITAMIN) tablet Take 1 tablet by mouth daily.    Marland Kitchen omeprazole (PRILOSEC) 20 MG capsule Take 1 capsule (20 mg total) by mouth daily. 30 capsule 5  . SUMAtriptan (IMITREX) 100 MG tablet Take 1 tablet (100 mg total) by mouth once. May repeat in 2 hours if headache persists  or recurs. 8 tablet 5  . traMADol (ULTRAM) 50 MG tablet TAKE 1 TABLET BY MOUTH 4 TIMES A DAY 120 tablet 2  . ALPRAZolam (XANAX) 0.5 MG tablet Take 1 tablet (0.5 mg total) by mouth at bedtime as needed. (Patient not taking: Reported on 10/04/2015) 30 tablet 5  . B Complex-C (B-COMPLEX WITH VITAMIN C) tablet Take 1 tablet by mouth daily. Reported on 10/04/2015    . fexofenadine-pseudoephedrine (ALLEGRA-D 24) 180-240 MG per 24 hr tablet Take 1 tablet by mouth daily. Reported on 10/04/2015    . promethazine (PHENERGAN) 25 MG tablet Take 1 tablet (25 mg total) by mouth every 6 (six) hours as needed for nausea or vomiting. 30 tablet 0   No current facility-administered medications for this visit.    Social History   Social History  . Marital status: Single    Spouse name: N/A  . Number of children: 0  . Years of education: N/A   Occupational History  . unemployed    Social History Main Topics  . Smoking status: Current Every Day Smoker    Packs/day: 0.50    Types: Cigarettes  . Smokeless tobacco: Not on file  . Alcohol use Yes  . Drug use: No  . Sexual activity: Yes   Other Topics Concern  . Not on file   Social History Narrative   Marital status: single; dating casually      Children: none      Lives: with mother      Employment: unemployed       Exercise: none   Family History  Problem Relation Age of Onset  . Depression Mother   . Hyperlipidemia Mother   . Anxiety disorder Mother   . Diabetes Father   . Hypertension Father   . Cancer Maternal Grandmother   . Cancer Maternal Grandfather   . Heart attack Maternal Grandfather   . Cancer Paternal Grandmother   . Alzheimer's disease Paternal Grandmother   . Cancer Paternal Grandfather        Objective:    BP 138/90   Pulse (!) 122   Temp 97.6 F (36.4 C) (Oral)   Resp 20   Ht  (1.651 m)   Wt 295 lb (133.8 kg)   LMP 09/14/2015   SpO2 98%   BMI 49.09 kg/m  Physical Exam  Constitutional: She is oriented to  person, place, and time. She appears well-developed and well-nourished. No distress.  HENT:  Head: Normocephalic and atraumatic.  Right Ear: External ear normal.  Left Ear: External ear normal.  Nose: Nose normal.  Mouth/Throat: Oropharynx is clear and moist.  Eyes: Conjunctivae and EOM are normal. Pupils are equal, round, and reactive to light.  Neck: Normal range of motion. Neck supple. Carotid bruit is not present. No thyromegaly present.  Cardiovascular: Normal rate, regular rhythm, normal heart sounds and intact distal pulses.  Exam reveals no gallop and no friction rub.   No murmur heard. Pulmonary/Chest: Effort normal and breath sounds normal. She has no wheezes. She has no rales.  Abdominal: Soft. Bowel sounds are normal. She exhibits no distension and no mass.  There is no tenderness. There is no rebound and no guarding.  Musculoskeletal:       Right shoulder: Normal.       Left shoulder: Normal.       Cervical back: She exhibits decreased range of motion, pain and spasm. She exhibits no tenderness, no bony tenderness and normal pulse.       Lumbar back: She exhibits pain. She exhibits normal range of motion, no tenderness, no bony tenderness, no spasm and normal pulse.  Lymphadenopathy:    She has no cervical adenopathy.  Neurological: She is alert and oriented to person, place, and time. No cranial nerve deficit.  Skin: Skin is warm and dry. No rash noted. She is not diaphoretic. No erythema. No pallor.  Psychiatric: She has a normal mood and affect. Her behavior is normal.   Results for orders placed or performed in visit on 04/14/14  POCT urinalysis dipstick  Result Value Ref Range   Color, UA yellow    Clarity, UA clear    Glucose, UA neg    Bilirubin, UA neg    Ketones, UA neg    Spec Grav, UA 1.015    Blood, UA neg    pH, UA 7.5    Protein, UA neg    Urobilinogen, UA 0.2    Nitrite, UA neg    Leukocytes, UA Negative   POCT urine pregnancy  Result Value Ref Range    Preg Test, Ur Negative        Assessment & Plan:   1. Cervical spondylosis without myelopathy   2. Spondylosis of lumbar region without myelopathy or radiculopathy   3. Spinal stenosis of cervicothoracic region   4. Chronic pain syndrome   5. Anxiety and depression   6. Essential hypertension, benign   7. Tachycardia    -refills on all medications provided to patient today. -will not provide with further refills. -pt to establish with local provider in [place]. -do not recommend Tramadol and hydrocodone together at this time; pt has been able to function without any medication for the past two months.   No orders of the defined types were placed in this encounter.  Meds ordered this encounter  Medications  . metoprolol (LOPRESSOR) 50 MG tablet    Sig: Take 1 tablet (50 mg total) by mouth 2 (two) times daily.    Dispense:  60 tablet    Refill:  5  . SUMAtriptan (IMITREX) 100 MG tablet    Sig: Take 1 tablet (100 mg total) by mouth once. May repeat in 2 hours if headache persists or recurs.    Dispense:  8 tablet    Refill:  5  . omeprazole (PRILOSEC) 20 MG capsule    Sig: Take 1 capsule (20 mg total) by mouth daily.    Dispense:  30 capsule    Refill:  5  . medroxyPROGESTERone (PROVERA) 10 MG tablet    Sig: Take 1 tablet (10 mg total) by mouth daily. Take every three months to induce a period.    Dispense:  10 tablet    Refill:  4  . gabapentin (NEURONTIN) 300 MG capsule    Sig: Take 1 capsule (300 mg total) by mouth 3 (three) times daily.    Dispense:  90 capsule    Refill:  5  . fluticasone (FLONASE) 50 MCG/ACT nasal spray    Sig: Place 2 sprays into both nostrils daily.    Dispense:  16 g    Refill:  6  .  cyclobenzaprine (FLEXERIL) 5 MG tablet    Sig: Take 1 tablet (5 mg total) by mouth 3 (three) times daily as needed. for muscle spasm    Dispense:  90 tablet    Refill:  5  . citalopram (CELEXA) 20 MG tablet    Sig: Take 2 tablets (40 mg total) by mouth daily.      Dispense:  60 tablet    Refill:  5  . DISCONTD: HYDROcodone-acetaminophen (NORCO) 7.5-325 MG tablet    Sig: Take 1 tablet by mouth 3 (three) times daily as needed.    Dispense:  90 tablet    Refill:  0  . DISCONTD: HYDROcodone-acetaminophen (NORCO) 7.5-325 MG tablet    Sig: Take 1 tablet by mouth 3 (three) times daily as needed.    Dispense:  90 tablet    Refill:  0    Do not fill until 11-04-2015  . HYDROcodone-acetaminophen (NORCO) 7.5-325 MG tablet    Sig: Take 1 tablet by mouth 3 (three) times daily as needed.    Dispense:  90 tablet    Refill:  0    Do not fill until 12-05-2015    Return in about 3 months (around 01/04/2016).    Austin Pongratz Paulita Fujita, M.D. [facility] [address] [place], [place]  [address] [phone] phone [phone] fax

## 2015-10-07 ENCOUNTER — Telehealth: Payer: Self-pay

## 2015-10-07 NOTE — Telephone Encounter (Signed)
Pt is needing a refill on tramadol and phenergan

## 2015-10-10 MED ORDER — PROMETHAZINE HCL 25 MG PO TABS: 25.0000 mg | ORAL_TABLET | Freq: Four times a day (QID) | ORAL | Status: AC | PRN

## 2015-10-10 NOTE — Telephone Encounter (Signed)
Dr. Katrinka BlazingSmith, is it ok for her to have a refill on the Tramadol and phenergan? Looks like the last tramadol was sent in on 04/28/15 for 120 tablets with 2 refills. Phenergan was last prescribed on 07/13/14.

## 2015-10-10 NOTE — Telephone Encounter (Signed)
I refilled Phenergan rx for patient; however, I recommend that patient only take hydrocodone for now.  I do not want to add Tramadol to hydrocodone at this time because patient has been off all medications for several months.

## 2015-10-10 NOTE — Telephone Encounter (Signed)
Unable to reach pt

## 2015-10-11 NOTE — Telephone Encounter (Signed)
Attempted to call pt, left VM for pt to call back asap  

## 2015-10-12 ENCOUNTER — Telehealth: Payer: Self-pay

## 2015-10-12 NOTE — Telephone Encounter (Signed)
Patient returned phone called. I read the message from Dr. Katrinka BlazingSmith to the patient. Patient just said ok and hung up. Forde RadonAJ

## 2016-06-22 ENCOUNTER — Ambulatory Visit: Payer: Self-pay

## 2016-06-30 ENCOUNTER — Ambulatory Visit: Payer: Self-pay

## 2016-07-12 ENCOUNTER — Ambulatory Visit: Payer: Self-pay

## 2016-07-14 ENCOUNTER — Other Ambulatory Visit: Payer: Self-pay | Admitting: Family Medicine

## 2016-09-13 DIAGNOSIS — Z0271 Encounter for disability determination: Secondary | ICD-10-CM

## 2017-08-15 ENCOUNTER — Encounter: Payer: Self-pay | Admitting: Family Medicine

## 2020-11-24 DEATH — deceased
